# Patient Record
Sex: Female | Born: 1975 | Race: White | Hispanic: No | Smoking: Current every day smoker
Health system: Southern US, Community
[De-identification: ages and names within clinical notes are randomized; demographics above are authoritative.]

## PROBLEM LIST (undated history)

## (undated) DIAGNOSIS — N200 Calculus of kidney: Secondary | ICD-10-CM

## (undated) DIAGNOSIS — B1920 Unspecified viral hepatitis C without hepatic coma: Secondary | ICD-10-CM

## (undated) DIAGNOSIS — F1920 Other psychoactive substance dependence, uncomplicated: Secondary | ICD-10-CM

## (undated) HISTORY — DX: Other psychoactive substance dependence, uncomplicated: F19.20

## (undated) HISTORY — PX: TUBAL LIGATION: SHX77

## (undated) HISTORY — PX: NOVASURE ABLATION: SHX5394

## (undated) HISTORY — PX: OTHER SURGICAL HISTORY: SHX169

## (undated) HISTORY — DX: Unspecified viral hepatitis C without hepatic coma: B19.20

## (undated) HISTORY — PX: ABDOMINAL SURGERY: SHX537

---

## 1998-04-13 ENCOUNTER — Other Ambulatory Visit: Admission: RE | Admit: 1998-04-13 | Discharge: 1998-04-13 | Payer: Self-pay | Admitting: Obstetrics and Gynecology

## 1998-09-03 ENCOUNTER — Other Ambulatory Visit: Admission: RE | Admit: 1998-09-03 | Discharge: 1998-09-03 | Payer: Self-pay | Admitting: Obstetrics and Gynecology

## 1999-08-05 ENCOUNTER — Other Ambulatory Visit: Admission: RE | Admit: 1999-08-05 | Discharge: 1999-08-05 | Payer: Self-pay | Admitting: Gynecology

## 1999-10-22 ENCOUNTER — Ambulatory Visit (HOSPITAL_COMMUNITY): Admission: RE | Admit: 1999-10-22 | Discharge: 1999-10-22 | Payer: Self-pay | Admitting: Dentistry

## 1999-10-22 ENCOUNTER — Encounter: Payer: Self-pay | Admitting: Dentistry

## 2000-08-05 ENCOUNTER — Other Ambulatory Visit: Admission: RE | Admit: 2000-08-05 | Discharge: 2000-08-05 | Payer: Self-pay | Admitting: Gynecology

## 2001-08-25 ENCOUNTER — Other Ambulatory Visit: Admission: RE | Admit: 2001-08-25 | Discharge: 2001-08-25 | Payer: Self-pay | Admitting: Gynecology

## 2002-09-01 ENCOUNTER — Other Ambulatory Visit: Admission: RE | Admit: 2002-09-01 | Discharge: 2002-09-01 | Payer: Self-pay | Admitting: Gynecology

## 2002-09-08 ENCOUNTER — Ambulatory Visit (HOSPITAL_BASED_OUTPATIENT_CLINIC_OR_DEPARTMENT_OTHER): Admission: RE | Admit: 2002-09-08 | Discharge: 2002-09-08 | Payer: Self-pay | Admitting: Otolaryngology

## 2003-05-25 ENCOUNTER — Other Ambulatory Visit: Admission: RE | Admit: 2003-05-25 | Discharge: 2003-05-25 | Payer: Self-pay | Admitting: Gynecology

## 2008-06-16 ENCOUNTER — Inpatient Hospital Stay (HOSPITAL_COMMUNITY): Admission: AD | Admit: 2008-06-16 | Discharge: 2008-06-18 | Payer: Self-pay | Admitting: Obstetrics and Gynecology

## 2008-06-16 ENCOUNTER — Encounter (HOSPITAL_COMMUNITY): Payer: Self-pay | Admitting: Obstetrics and Gynecology

## 2008-10-14 ENCOUNTER — Ambulatory Visit (HOSPITAL_COMMUNITY): Admission: RE | Admit: 2008-10-14 | Discharge: 2008-10-14 | Payer: Self-pay | Admitting: Family Medicine

## 2010-02-11 ENCOUNTER — Ambulatory Visit (HOSPITAL_COMMUNITY): Admission: RE | Admit: 2010-02-11 | Payer: Self-pay | Admitting: Obstetrics and Gynecology

## 2010-04-06 ENCOUNTER — Encounter (HOSPITAL_COMMUNITY): Payer: Self-pay | Admitting: Obstetrics and Gynecology

## 2010-06-26 LAB — CBC
HCT: 34.1 % — ABNORMAL LOW (ref 36.0–46.0)
Hemoglobin: 10.5 g/dL — ABNORMAL LOW (ref 12.0–15.0)
Hemoglobin: 11.5 g/dL — ABNORMAL LOW (ref 12.0–15.0)
MCHC: 33.7 g/dL (ref 30.0–36.0)
MCHC: 33.7 g/dL (ref 30.0–36.0)
MCV: 94.3 fL (ref 78.0–100.0)
MCV: 94.8 fL (ref 78.0–100.0)
Platelets: 232 10*3/uL (ref 150–400)
RBC: 3.28 MIL/uL — ABNORMAL LOW (ref 3.87–5.11)
RBC: 3.62 MIL/uL — ABNORMAL LOW (ref 3.87–5.11)
RDW: 13.6 % (ref 11.5–15.5)
WBC: 10.3 10*3/uL (ref 4.0–10.5)
WBC: 11 10*3/uL — ABNORMAL HIGH (ref 4.0–10.5)

## 2010-07-09 ENCOUNTER — Emergency Department (HOSPITAL_COMMUNITY)
Admission: EM | Admit: 2010-07-09 | Discharge: 2010-07-09 | Disposition: A | Discharge status: Home / Self Care | Payer: Self-pay | Attending: Emergency Medicine | Admitting: Emergency Medicine

## 2010-07-09 DIAGNOSIS — F1110 Opioid abuse, uncomplicated: Secondary | ICD-10-CM | POA: Insufficient documentation

## 2010-07-30 NOTE — Op Note (Signed)
[name], [name]              ACCOUNT NO.:  [id number]   MEDICAL RECORD NO.:  [id number]          PATIENT TYPE:  INP   LOCATION:  9125                          FACILITY:  [facility]   PHYSICIAN:  Zelphia Cairo, MD    DATE OF BIRTH:  [birth year]   DATE OF PROCEDURE:  06/16/2008  DATE OF DISCHARGE:                               OPERATIVE REPORT   PREOPERATIVE DIAGNOSES:  1. Intrauterine pregnancy at 35 plus 6 weeks.  2. Preterm labor despite tocolytics.   PROCEDURE:  Repeat low transverse cesarean delivery.   SURGEON:  Zelphia Cairo, MD   ANESTHESIA:  Spinal.   FINDINGS:  Viable female infant with Apgars of 9 and 9 weighing 6 pounds  0 ounces, pH 7.30.   SPECIMEN:  Placenta to Pathology.   URINE OUTPUT:  Clear.   ESTIMATED BLOOD LOSS:  700 mL.   COMPLICATIONS:  None.   CONDITION:  Stable to recovery room.   PROCEDURE IN DETAIL:  Kayla Reese was taken to the operating room.  After  informed consent was obtained, she was placed in the supine position  with a left tilt.  She was prepped and draped in sterile fashion.  A  Foley catheter was inserted.  Pfannenstiel skin incision was made with a  scalpel and carried down to the underlying fascia.  The fascia was  incised in the midline.  This was extended laterally using Mayo  scissors.  The fascia was grasped with Kocher clamps, tented upwards,  and underlying rectus muscles were dissected off using curved Mayo  scissors.  Peritoneum was then identified and entered sharply with  Metzenbaum scissors.  This was extended superiorly and inferiorly with  good visualization of the bladder.  The bladder was tightly adhered to  the lower uterine segment.  This was gently dissected off using  Metzenbaum scissors.  The bladder blade was then placed.   Uterine incision was made with a scalpel.  Membranes ruptured for clear  fluid.  Fetal vertex was brought to the uterine incision.  The mouth and  nose were suctioned and the infant was  delivered with fundal pressure.  The cord was clamped and cut as the mouth and nose were suctioned and  the infant was taken to the awaiting pediatric staff.  Placenta was  manually removed from the uterus.  The uterus was cleared of all clots  and debris using a dry lap sponge and the uterine incision was  reapproximated in a double-layer closure using 0 chromic in a running  locked fashion.  Once hemostasis was assured, the uterus was placed back  into the pelvis.  The pelvis was irrigated with  warm normal saline.  Uterine incision was reinspected and found to be  hemostatic.  Peritoneum was reapproximated with 0 Monocryl.  The fascia  was closed with a looped 0 PDS and the skin was closed with staples.  Sponge, lap, needle, and instrument counts correct x2.      Zelphia Cairo, MD  Electronically Signed     GA/MEDQ  D:  06/16/2008  T:  06/16/2008  Job:  [id number]

## 2010-08-02 NOTE — Discharge Summary (Signed)
[name], Kayla Reese              ACCOUNT NO.:  [id number]   MEDICAL RECORD NO.:  [id number]          PATIENT TYPE:  INP   LOCATION:  9125                          FACILITY:  [facility]   PHYSICIAN:  Zelphia Cairo, MD    DATE OF BIRTH:  March 30, 1975   DATE OF ADMISSION:  06/16/2008  DATE OF DISCHARGE:  06/18/2008                               DISCHARGE SUMMARY   ADMITTING DIAGNOSES:  1. Intrauterine pregnancy at 35+ weeks' estimated gestational age.  2. Spontaneous onset of labor.  3. History of prior cesarean section.   DISCHARGE DIAGNOSES:  1. Status post low transverse cesarean section.  2. Viable female infant.   PROCEDURE:  Repeat low transverse cesarean section.   REASON FOR ADMISSION:  Please see written H&P.   HOSPITAL COURSE:  The patient is a 35 year old gravida 2, para 1 that  was admitted to [facility] at 35+ weeks estimated  gestational age with spontaneous onset of labor.  The patient had  history of a prior C-section.  On admission, the patient was given subcu  terbutaline 2 times without tocolysis of her labor.  The patient was now  taken to the operating room where spinal anesthesia was administered  without difficulty.  A low-transverse incision was made with delivery of  a viable female infant weighing 6 pounds 0 ounces, Apgars of 9 at 1  minute and 9 at 5 minutes.  The patient tolerated the procedure well and  taken to the recovery room in stable condition.  On postoperative day  #1, the patient was without complaint.  Vital signs remained stable.  She was afebrile.  Abdomen was soft with good return of bowel function.  Fundus was firm and nontender.  Abdominal dressing was noted to be  clean, dry, and intact.  Laboratory findings showed hemoglobin of 10.5,  platelet count of 200,000, WBC count of 11.0.  On postoperative day #2,  the patient did desire early discharge.  Vital signs were stable.  She  was afebrile.  Abdomen was soft.  Fundus firm  and nontender.  Incision  was clean, dry, and intact.  Staples were intact.   DISCHARGE INSTRUCTIONS:  Reviewed and the patient was later discharged  home.   CONDITION ON DISCHARGE:  Stable.   DIET:  Regular as tolerated.   ACTIVITY:  No heavy lifting, no driving x2 weeks, no vaginal entry.   FOLLOWUP:  The patient to follow up in the office in 2-3 days for staple  removal.  She is to call for temperature greater than 100 degrees,  persistent nausea, vomiting, heavy vaginal bleeding, and/or redness, or  drainage from incisional site.   DISCHARGE MEDICATIONS:  1. Tylox #31 p.o. every 4-6 hours p.r.n.  2. Motrin 600 mg every 6 hours.  3. Prenatal vitamins one p.o. daily.  4. Colace one p.o. daily.      Julio Sicks, N.P.      Zelphia Cairo, MD  Electronically Signed    CC/MEDQ  D:  07/03/2008  T:  07/04/2008  Job:  [id number]

## 2010-08-02 NOTE — Op Note (Signed)
NAME:  Kayla Reese, Kayla Reese                        ACCOUNT NO.:  [id number]   MEDICAL RECORD NO.:  [id number]                   PATIENT TYPE:  AMB   LOCATION:  [facility]                                 FACILITY:  [facility]   PHYSICIAN:  Joanna Puff, M.D.            DATE OF BIRTH:  07-Mar-1976   DATE OF PROCEDURE:  09/08/2002  DATE OF DISCHARGE:  09/08/2002                                 OPERATIVE REPORT   PREOPERATIVE DIAGNOSES:  1. Severely deviated septum producing marked nasal obstruction on the right     side.  2. External pyramid dip deformities.   POSTOPERATIVE DIAGNOSES:  1. Severely deviated septum producing marked nasal obstruction on the right     side.  2. External pyramid dip deformities.   OPERATION/PROCEDURE:  Septal rhinoplasty.   SURGEON:  Joanna Puff, M.D.   ANESTHESIA:  General endotracheal anesthesia by Mr. Greggory Stallion.   DESCRIPTION OF PROCEDURE:  The patient was brought to the operating room and  placed in the supine position.  Adequate general endotracheal anesthesia was  obtained.  The head was draped in the usual manner.  Supplemental anesthesia  was given in the nose by blocking and infiltrating the nose with 1%  Xylocaine with adrenalin and topical application of 4% cocaine solution  intranasally.   After adequate time for the local to work well, bilateral intercartilaginous  incisions were made and small retaining edge of septal cartilage then  trimmed.  The skin was then undermined over the dorsum of the nose.  Rim  excision and lateral cartilage delivered.  There was a marked ballooning of  lateral ________and this was corrected with complete strip technique.  A  button-end knife was then inserted into the intercartilaginous incision and  brought over the caudal nose to make a complete transfixion incision.  Bilateral  mucoperichondrial and mucoperiosteal elevated.  This exposed the  cartilaginous bony septum as well as the severe deviation.  The  septum was  off the spiny crest in the right ala, producing severe buckling.  There was  also evidence of an old fracture site at this point.  Inferior strip of  cartilaginous strip was then removed.  The cartilaginous septum was then  dissected free from the bony septum and a marked bony spur was removed.  The  angulation of the cartilaginous septum was then improved by removing a strip  of cartilaginous septum at the severe bend.  This allowed the septum to  spring free back to the midline.  Submucoperichondrial tunnels were then  made beneath the junction of upper lateral cartilage and cartilaginous  dorsum.  These two structures were separated with angled scissors.  The  cartilaginous dorsum in the lower midline _______ of the superior aspect of  the nostril and then using a 12 mm chisel, slight fullness along the nasal  dorsum was removed.  The upper lateral cartilage and  cartililaginous dorsum  were further lowered  to conform to the new ____ nose and bony pyramid.  Vestibular incision made in the lateral _______ were performed with the  guarded chisel.  Infracture of nasal bone was carried out.  Inspection of  the inner septum and ________ revealed it to be in good alignment.  There  was still some fullness in the nasal tip and this was corrected by  delivering the lower lateral cartilage to the right rim incision.  The dome  areas were sutured together with interrupted suture of 5-0 Monocryl.  The  tip was returned to the midline and there was good alignment of the pyramid  and tip.  The transfixion incision was then closed with a through-and-  through mattress suture of 3-0 plain catgut as well as interrupted suture of  5-0 plain catgut.  Rim incisions were sutured with 5-0 plain catgut.  A  small incision was made in the columella on the right side and a pocket was  made in front of the mesial crura.  A cartilaginous strip was then placed in  front of this area.  This incision was  closed with 5-0 plain catgut.  Intranasal splints inserted and sutured in place with 3-0 Ethilon.  An  external splint was applied.  The patient was then awakened and extubated  and returned to the recovery room for x-ray prior to being discharged home.   She is scheduled for the office in 24 hours for routine followup.   DISCHARGE MEDICATIONS:  1. Cefalexin 250 mg one q.i.d.  2. Sterapred 5 mg Dosepak.  3. Vicodin 7.5 mg one tablet q.3-4h. p.r.n. for pain.   CONDITION ON DISCHARGE:  Good.   COMPLICATIONS:  None.                                               Joanna Puff, M.D.    LLP/MEDQ  D:  09/11/2002  T:  09/11/2002  Job:  [id number]

## 2010-08-02 NOTE — Op Note (Signed)
NAME:  Kayla Reese, Kayla Reese                        ACCOUNT NO.:  [id number]   MEDICAL RECORD NO.:  [id number]                   PATIENT TYPE:  AMB   LOCATION:  N[facility]                                 FACILITY:  [facility]   PHYSICIAN:  Joanna Puff, M.D.            DATE OF BIRTH:  1975-06-26   DATE OF PROCEDURE:  09/08/2002  DATE OF DISCHARGE:  09/08/2002                                 OPERATIVE REPORT   PREOPERATIVE DIAGNOSES:  1. Severely deviated septum producing marked nasal obstruction on the right     side.  2. External paramedian tip deformities.   POSTOPERATIVE DIAGNOSES:  1. Severely deviated septum producing marked nasal obstruction on the right     side.  2. External paramedian tip deformities.   OPERATION:  Septorhinoplasty.   SURGEON:  Joanna Puff, M.D.   ANESTHESIA:  General endotracheal per Mr. Greggory Stallion.   DESCRIPTION OF PROCEDURE:  The patient was brought to the operating room and  placed in a supine position. After adequate general endotracheal anesthesia  was obtained, the head was draped in the usual manner. Septal anesthesia was  given through the nose by blocking and  infiltrating the nose with 1%  Xylocaine with adrenaline and topical application of 4% cocaine solution  intranasally.   After adequate time for the local anesthesia to work well, bilateral  intercartilaginous incisions were made and the small returning edge of the  upper lateral cartilage was then trimmed. The skin was then further mined  over the dorsum of the nose. Rim incisions were made and the lower lateral  cartilage was delivered. There was marked ballooning of the lateral crus.  This was corrected by using a complete rim strip technique. A buttoning  knife was then inserted through the intercartilaginous incision and brought  onto the caudal end of the nose making for a complete transfix incision.  Bilateral mucoperichondrial and mucoperiosteal flaps  were elevated. This  exposed the cartilaginous and bony septum and this exposed the severe  deviations of the cartilaginous and bony septum. The septum was off the  nasal spine into the right airway, produced severe bucking. There was also  evidence of old fracture site at this point. An inferior strip of  cartilaginous septum was then removed. The cartilaginous septum was  dissected free from the bleeding edge of the perpendicular plate of the  ethmoid and vomer and a large bony spur was removed. Angulation of the  cartilaginous septum was then improved by removing a strip of cartilaginous  septum at the severe bend. This allowed the septum to swing free back to the  midline. Submucoperichondral tunnels were then made beneath the junction of  the upper lateral cartilage and cartilaginous dorsum. These two structures  were separated with angles scissors. The cartilaginous dorsum then lowered  to about the level of the superior aspect of the nostril. Then using a 14 mm  chisel, the asymmetrical bony  pyramid hump was removed. The upper lateral  cartilage and cartilaginous dorsum was further lowered to conform to the new  height of the nasal bony pyramid. Vesicular incision made, lateral  osteotomies were performed with a guarded chisel. In-fracture of the nasal  bone was carried out. Inspection revealed the septum and pyramid to be in  good alignment. There was still some fullness to the nasal tip. This was  corrected by delivering the lower lateral cartilage through the right rim  incision. The domes of these two cartilages were sutured together with  interrupted sutures of 5-0 Monocryl. It was returned to the midline with a  good alignment of the pyramid and tip. The transfixing incision was then  closed with a base suture through and through plain catgut as well as  interrupted sutures of 5-0 plain catgut. The rim incisions were closed with  interrupted suture of 5-0 plain catgut. A small incision made on the  right  side of the columella and a pocket was created in front of the mesial crura.  A cartilaginous strut was then placed in front of the medial crura.  This  incision was closed with 5-0 plain catgut. Intranasal splints inserted and  sutured in place with 3-0 Ethilon. An external splint was applied. The  patient was then awakened, extubated and returned to the recovery room prior  to being discharged home. She is scheduled to return to the hospital in 24  hours for routine followup.   DISCHARGE MEDICATIONS:  1. Cephalexin 250 mg p.o. q.i.d.  2. Sterapred 5 mg Dosepak.  3. Vicodin 7.5 mg one tablet every 3-4h. p.r.n. for pain.   CONDITION ON DISCHARGE:  Good.   COMPLICATIONS:  None.                                               Joanna Puff, M.D.    LLP/MEDQ  D:  12/05/2002  T:  12/05/2002  Job:  [id number]

## 2010-08-02 NOTE — Op Note (Signed)
NAME:  Kayla Reese, Kayla Reese                        ACCOUNT NO.:  [id number]   MEDICAL RECORD NO.:  [id number]                   PATIENT TYPE:  AMB   LOCATION:  [facility]                                 FACILITY:  [facility]   PHYSICIAN:  Joanna Puff, M.D.            DATE OF BIRTH:  09-05-1975   DATE OF PROCEDURE:  09/08/2002  DATE OF DISCHARGE:  09/08/2002                                 OPERATIVE REPORT   PREOPERATIVE DIAGNOSES:  1. Deviated septum.  2. External pyramid tip deformities.   POSTOPERATIVE DIAGNOSES:  1. Deviated septum.  2. External pyramid tip deformities.   OPERATION/PROCEDURE:  Septal rhinoplasty.   SURGEON:  Joanna Puff, M.D.   ANESTHESIA:  General endotracheal anesthesia.   DESCRIPTION OF PROCEDURE:  The patient was brought to the operating room and  placed in the supine position. After adequate general anesthesia had been  obtained, the head was draped in the usual manner.  Supplemental anesthesia  given in the nose with blocking and infiltrating the nose with 1% Xylocaine  with adrenalin and a topical application of 4% cocaine solution  intranasally.   After adequate time for local to work well, bilateral intercartilaginous  incisions were made and upper lateral cartilage exposed. Skin was then  further mounted over the dorsal nose.  A small returning edge of upper  lateral cartilage was trimmed.  The rim incision was then made and the lower  lateral cartilage delivered.  It was marked including the lateral crease.  This was to correct the complete rim strip technique.  A button knife was  then inserted through the intercartilaginous incision and brought out caudal  end of nose which made for complete transfixing incision.  Bilateral  mucoperichondrial and mucoperiosteal flaps were elevated.  This exposed a  marked deviation of the septum, particularly the right side.  The  cartilaginous deviation was then removed by sharp dissection.  The  cartilaginous septum was then dissected free from the leading edge of  perpendicular ethmoid and vomer, and a large bony spur as well as a widened  crest was corrected with chisels and rongeurs.  The septum was returned to  the midline.  There was good airway bilaterally.  Submucoperichondrial  tunnels were then made beneath the junction of the upper lateral cartilage  and cartilaginous dorsum.  These two structures were separated with angled  scissors.  Cartilaginous dorsum was enlarged slightly about the level of the  superior aspect of the nostril.  Then using a 14 mm chisel, the asymmetry of  the nasal pyramid was corrected.  The upper lateral cartilage and  cartilaginous dorsum were then further lowered to conform to the new height  of the nasal bony pyramid.  Vestibular incision made on the lateral os to os  with a Fomon guarded chisel.  Infracture of the nasal bone was carried.  Inspection then revealed septum appeared to be in alignment.  There was  still some fullness in the nasal tip and this was corrected by delivering  the lower lateral cartilage through the right rim incision.  The domes of  the lower lateral cartilage were then sutured together with interrupted 6-0  and 5-0 Monocryl.  It was returned to the midline.  There was good narrowing  of the tip of the nose.  The transfixation suture was then closed with base  suture of 3-0 plain catgut as well as interrupted suture of 5-0 plain  catgut.  Rim incision closed with interrupted suture of 5-0 plain catgut.  Intranasal splints inserted and sutured in place with 3-0 Ethilon.  An  external splint was applied.  The patient then awakened, extubated and  returned to the recovery room for activity prior to being discharged home.   She is scheduled in the office in 24 hours for routine followup.   DISCHARGE MEDICATIONS:  1. Cefalexin 250 mg p.o. q.i.d.  2. Sterapred ophthalmic Dosepak.  3. Mepergan Fortis one tablet q.3-4h.  p.r.n. for pain.   CONDITION ON DISCHARGE:  Good.   COMPLICATIONS:  None                                               Joanna Puff, M.D.    LLP/MEDQ  D:  11/04/2002  T:  11/05/2002  Job:  [id number]

## 2010-08-25 ENCOUNTER — Inpatient Hospital Stay (INDEPENDENT_AMBULATORY_CARE_PROVIDER_SITE_OTHER)
Admission: RE | Admit: 2010-08-25 | Discharge: 2010-08-25 | Disposition: A | Discharge status: Home / Self Care | Payer: Self-pay | Source: Ambulatory Visit | Attending: Emergency Medicine | Admitting: Emergency Medicine

## 2010-08-25 ENCOUNTER — Ambulatory Visit (INDEPENDENT_AMBULATORY_CARE_PROVIDER_SITE_OTHER): Payer: Self-pay

## 2010-08-25 DIAGNOSIS — M549 Dorsalgia, unspecified: Secondary | ICD-10-CM

## 2010-08-25 LAB — POCT URINALYSIS DIP (DEVICE)
Bilirubin Urine: NEGATIVE
Glucose, UA: NEGATIVE mg/dL
Hgb urine dipstick: NEGATIVE
Nitrite: NEGATIVE
Specific Gravity, Urine: 1.02 (ref 1.005–1.030)
Urobilinogen, UA: 0.2 mg/dL (ref 0.0–1.0)
pH: 7 (ref 5.0–8.0)

## 2011-06-21 ENCOUNTER — Emergency Department (HOSPITAL_COMMUNITY)
Admission: EM | Admit: 2011-06-21 | Discharge: 2011-06-22 | Disposition: A | Discharge status: Home / Self Care | Payer: Self-pay | Attending: Emergency Medicine | Admitting: Emergency Medicine

## 2011-06-21 ENCOUNTER — Encounter (HOSPITAL_COMMUNITY): Payer: Self-pay

## 2011-06-21 DIAGNOSIS — F17200 Nicotine dependence, unspecified, uncomplicated: Secondary | ICD-10-CM | POA: Insufficient documentation

## 2011-06-21 DIAGNOSIS — R51 Headache: Secondary | ICD-10-CM | POA: Insufficient documentation

## 2011-06-21 DIAGNOSIS — F1110 Opioid abuse, uncomplicated: Secondary | ICD-10-CM | POA: Insufficient documentation

## 2011-06-21 DIAGNOSIS — F1910 Other psychoactive substance abuse, uncomplicated: Secondary | ICD-10-CM

## 2011-06-21 LAB — COMPREHENSIVE METABOLIC PANEL WITH GFR
ALT: 13 U/L (ref 0–35)
AST: 15 U/L (ref 0–37)
Albumin: 4.2 g/dL (ref 3.5–5.2)
Alkaline Phosphatase: 68 U/L (ref 39–117)
BUN: 15 mg/dL (ref 6–23)
CO2: 28 meq/L (ref 19–32)
Calcium: 9.4 mg/dL (ref 8.4–10.5)
Chloride: 99 meq/L (ref 96–112)
Creatinine, Ser: 0.66 mg/dL (ref 0.50–1.10)
GFR calc Af Amer: >90 mL/min
GFR calc non Af Amer: >90 mL/min
Glucose, Bld: 101 mg/dL — ABNORMAL HIGH (ref 70–99)
Potassium: 3.4 meq/L — ABNORMAL LOW (ref 3.5–5.1)
Sodium: 136 meq/L (ref 135–145)
Total Bilirubin: 0.4 mg/dL (ref 0.3–1.2)
Total Protein: 7.8 g/dL (ref 6.0–8.3)

## 2011-06-21 LAB — CBC
HCT: 43.7 % (ref 36.0–46.0)
Hemoglobin: 14.9 g/dL (ref 12.0–15.0)
MCH: 29.7 pg (ref 26.0–34.0)
MCV: 87.1 fL (ref 78.0–100.0)
RBC: 5.02 MIL/uL (ref 3.87–5.11)

## 2011-06-21 LAB — RAPID URINE DRUG SCREEN, HOSP PERFORMED
Amphetamines: NOT DETECTED
Barbiturates: NOT DETECTED
Benzodiazepines: NOT DETECTED
Cocaine: NOT DETECTED
Opiates: POSITIVE — AB
Tetrahydrocannabinol: POSITIVE — AB

## 2011-06-21 LAB — ETHANOL: Alcohol, Ethyl (B): <11 mg/dL (ref 0–11)

## 2011-06-21 LAB — PREGNANCY, URINE: Preg Test, Ur: NEGATIVE

## 2011-06-21 MED ORDER — METHOCARBAMOL 500 MG PO TABS
500.0000 mg | ORAL_TABLET | Freq: Three times a day (TID) | ORAL | Status: DC | PRN
  Filled 2011-06-21: qty 1

## 2011-06-21 MED ORDER — DICYCLOMINE HCL 20 MG PO TABS: 20.0000 mg | ORAL_TABLET | ORAL | Status: DC | PRN

## 2011-06-21 MED ORDER — NAPROXEN 500 MG PO TABS
500.0000 mg | ORAL_TABLET | Freq: Two times a day (BID) | ORAL | Status: DC | PRN
  Administered 2011-06-21: 500 mg via ORAL
  Filled 2011-06-21: qty 1

## 2011-06-21 MED ORDER — LOPERAMIDE HCL 2 MG PO CAPS
2.0000 mg | ORAL_CAPSULE | ORAL | Status: DC | PRN
Start: 2011-06-21 — End: 2011-06-22

## 2011-06-21 MED ORDER — CLONIDINE HCL 0.1 MG PO TABS
0.1000 mg | ORAL_TABLET | Freq: Four times a day (QID) | ORAL | Status: DC
  Administered 2011-06-21: 0.1 mg via ORAL
  Filled 2011-06-21 (×2): qty 1

## 2011-06-21 MED ORDER — DIPHENHYDRAMINE HCL 25 MG PO CAPS
25.0000 mg | ORAL_CAPSULE | Freq: Once | ORAL | Status: AC
  Administered 2011-06-21: 25 mg via ORAL
  Filled 2011-06-21: qty 1

## 2011-06-21 MED ORDER — ONDANSETRON 4 MG PO TBDP: 4.0000 mg | ORAL_TABLET | Freq: Four times a day (QID) | ORAL | Status: DC | PRN

## 2011-06-21 MED ORDER — CLONIDINE HCL 0.1 MG PO TABS: 0.1000 mg | ORAL_TABLET | Freq: Every day | ORAL | Status: DC

## 2011-06-21 MED ORDER — CLONIDINE HCL 0.1 MG PO TABS: 0.1000 mg | ORAL_TABLET | ORAL | Status: DC

## 2011-06-21 MED ORDER — NICOTINE 21 MG/24HR TD PT24
21.0000 mg | MEDICATED_PATCH | Freq: Every day | TRANSDERMAL | Status: DC
  Administered 2011-06-21: 21 mg via TRANSDERMAL
  Filled 2011-06-21: qty 1

## 2011-06-21 MED ORDER — HYDROXYZINE HCL 25 MG PO TABS: 25.0000 mg | ORAL_TABLET | Freq: Four times a day (QID) | ORAL | Status: DC | PRN

## 2011-06-21 NOTE — ED Notes (Signed)
Pt's mother took pt's belongings home.

## 2011-06-21 NOTE — ED Notes (Signed)
Pt declined nicotine patch.

## 2011-06-21 NOTE — ED Notes (Signed)
Pt in from home request detox from heroin states last use Thursday and today denies s/i denies h/i states headache at present time pt presents tearful however cooperative

## 2011-06-21 NOTE — ED Provider Notes (Signed)
History     CSN: [id number]  Arrival date & time 06/21/11  1717   First MD Initiated Contact with Patient 06/21/11 1750      Chief Complaint  Patient presents with  . 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 She does take Suboxone. She is trying to get into a rehabilitation program at rehabilitation program does not to detox. She denies any threats or herself or others. She denies suicidal or homicidal ideation denies hallucinations. She denies withdrawal symptoms now. She took her Suboxone today. Her last heroine IV was 2 days ago. Her last detox was in the 1990s. She is a gradual onset typical headache for her today for several hours with no sudden headache and no focal neurologic symptoms and no change in speech vision swallowing or understanding or localized weakness numbness or incoordination. History reviewed. No pertinent past medical history. Substance abuse Past Surgical History  Procedure Date  . Abdominal surgery     No family history on file.  History  Substance Use Topics  . Smoking status: Current Everyday Smoker  . Smokeless tobacco: Not on file  . Alcohol Use: No    OB History    Grav Para Term Preterm Abortions TAB SAB Ect Mult Living                  Review of Systems  Constitutional: Negative for fever.       10 Systems reviewed and are negative for acute change except as noted in the HPI.  HENT: Negative for congestion.   Eyes: Negative for discharge and redness.  Respiratory: Negative for cough and shortness of breath.   Cardiovascular: Negative for chest pain.  Gastrointestinal: Negative for vomiting and abdominal pain.  Musculoskeletal: Negative for back pain.  Skin: Negative for rash.  Neurological: Positive for headaches. Negative for syncope, speech difficulty, weakness and numbness.    Psychiatric/Behavioral:       No behavior change.    Allergies  Review of patient's allergies indicates no known allergies.  Home Medications   Current Outpatient Rx  Name Route Sig Dispense Refill  . BUPRENORPHINE HCL-NALOXONE HCL 8-2 MG SL SUBL Sublingual Place 1 tablet under the tongue 2 (two) times daily.      BP 102/75  Pulse 82  Temp(Src) 98.8 F (37.1 C) (Oral)  Resp 20  SpO2 98%  LMP 06/07/2011  Physical Exam  Nursing note and vitals reviewed. Constitutional:       Awake, alert, nontoxic appearance with baseline speech for patient.  HENT:  Head: Atraumatic.  Mouth/Throat: No oropharyngeal exudate.  Eyes: EOM are normal. Pupils are equal, round, and reactive to light. Right eye exhibits no discharge. Left eye exhibits no discharge.  Neck: Neck supple.  Cardiovascular: Normal rate and regular rhythm.   No murmur heard. Pulmonary/Chest: Effort normal and breath sounds normal. No stridor. No respiratory distress. She has no wheezes. She has no rales. She exhibits no tenderness.  Abdominal: Soft. Bowel sounds are normal. She exhibits no mass. There is no tenderness. There is no rebound.  Musculoskeletal: She exhibits no tenderness.       Baseline ROM, moves extremities with no obvious new focal weakness.  Lymphadenopathy:    She has no cervical adenopathy.  Neurological: She is alert.       Awake, alert, cooperative and aware of situation; motor strength bilaterally; sensation normal to light touch bilaterally; peripheral visual  fields full to confrontation; no facial asymmetry; tongue midline; major cranial nerves appear intact; no pronator drift, normal finger to nose bilaterally  Skin: No rash noted.  Psychiatric: She has a normal mood and affect.    ED Course  Procedures (including critical care time)  Labs Reviewed  COMPREHENSIVE METABOLIC PANEL - Abnormal; Notable for the following:    Potassium 3.4 (*)    Glucose, Bld 101 (*)    All other components  within normal limits  URINE RAPID DRUG SCREEN (HOSP PERFORMED) - Abnormal; Notable for the following:    Opiates POSITIVE (*)    Tetrahydrocannabinol POSITIVE (*)    All other components within normal limits  CBC  ETHANOL  PREGNANCY, URINE  LAB REPORT - SCANNED   No results found.   1. Substance abuse       MDM  Patient / Family / Caregiver understand and agree with initial ED impression and plan with expectations set for ED visit.        Hurman Horn, MD 06/23/11 2114

## 2011-06-22 NOTE — ED Notes (Signed)
Dr knapp into see 
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
## 2011-06-22 NOTE — ED Notes (Signed)
Mom back into see, pt resting quietly

## 2011-06-22 NOTE — ED Notes (Signed)
Talking w/ mother 

## 2011-06-22 NOTE — ED Notes (Signed)
Pt's mom into see 

## 2011-06-22 NOTE — ED Provider Notes (Addendum)
Patient is here requesting assistance with substance abuse problems.  She's not suicidal or homicidal.  She would like to be released but does want to go to a treatment center.  Filed Vitals:   06/22/11 0549  BP: 88/50  Pulse: 61  Temp: 98.4 F (36.9 C)  Resp: 18   We'll have the act team talked to the patient today about getting her transfer over to the inpatient treatment center that she has made arrangements with.  Celene Kras, MD 06/22/11 [phone]  Pt was accepted at [facility], MD 06/22/11 1415

## 2011-06-22 NOTE — ED Notes (Signed)
Pt dc'd w/ written instructions, pt to go to [facility] for admission

## 2011-06-22 NOTE — ED Notes (Signed)
Husband into see 

## 2011-06-22 NOTE — ED Notes (Signed)
ACT into see-mom at bedside

## 2011-06-22 NOTE — Discharge Instructions (Signed)
Chemical Dependency Chemical dependency is an addiction to drugs or alcohol. It is characterized by the repeated behavior of seeking out and using drugs and alcohol despite harmful consequences to the health and safety of ones self and others.  RISK FACTORS There are certain situations or behaviors that increase a person's risk for chemical dependency. These include:  A family history of chemical dependency.   A history of mental health issues, including depression and anxiety.   A home environment where drugs and alcohol are easily available to you.   Drug or alcohol use at a young age.  SYMPTOMS  The following symptoms can indicate chemical dependency:  Inability to limit the use of drugs or alcohol.   Nausea, sweating, shakiness, and anxiety that occurs when alcohol or drugs are not being used.   An increase in amount of drugs or alcohol that is necessary to get drunk or high.  People who experience these symptoms can assess their use of drugs and alcohol by asking themselves the following questions:  Have you been told by friends or family that they are worried about your use of alcohol or drugs?   Do friends and family ever tell you about things you did while drinking alcohol or using drugs that you do not remember?   Do you lie about using alcohol or drugs or about the amounts you use?   Do you have difficulty completing daily tasks unless you use alcohol or drugs?   Is the level of your work or school performance lower because of your drug or alcohol use?   Do you get sick from using drugs or alcohol but keep using anyway?   Do you feel uncomfortable in social situations unless you use alcohol or drugs?   Do you use drugs or alcohol to help forget problems?  An answer of yes to any of these questions may indicate chemical dependency. Professional evaluation is suggested. Document Released: 02/25/2001 Document Revised: 02/20/2011 Document Reviewed: 05/09/2010 ExitCare  Patient Information 2012 [employer]. 

## 2011-06-22 NOTE — BHH Counselor (Signed)
Patient referred to [facility] after verifying bed availability. Phone referral completed. Faxed patients labs and clinicals to their facility so that they may review and keep for their records. Patient officially accepted to [facility] and will need to be their prior to 4pm today.

## 2011-06-22 NOTE — ED Notes (Signed)
Belongings returned

## 2011-11-26 ENCOUNTER — Ambulatory Visit: Payer: Self-pay | Admitting: Family Medicine

## 2011-11-26 ENCOUNTER — Ambulatory Visit: Payer: Self-pay

## 2011-11-26 VITALS — BP 96/58 | HR 121 | Temp 99.8°F | Resp 18 | Ht 64.0 in | Wt 118.0 lb

## 2011-11-26 DIAGNOSIS — R0602 Shortness of breath: Secondary | ICD-10-CM

## 2011-11-26 DIAGNOSIS — B379 Candidiasis, unspecified: Secondary | ICD-10-CM

## 2011-11-26 DIAGNOSIS — M549 Dorsalgia, unspecified: Secondary | ICD-10-CM

## 2011-11-26 DIAGNOSIS — R000 Tachycardia, unspecified: Secondary | ICD-10-CM

## 2011-11-26 DIAGNOSIS — J189 Pneumonia, unspecified organism: Secondary | ICD-10-CM

## 2011-11-26 LAB — POCT CBC
Granulocyte percent: 92 %G — AB (ref 37–80)
HCT, POC: 45.7 % (ref 37.7–47.9)
Hemoglobin: 14.6 g/dL (ref 12.2–16.2)
Lymph, poc: 0.8 (ref 0.6–3.4)
MCH, POC: 29.3 pg (ref 27–31.2)
MCHC: 31.9 g/dL (ref 31.8–35.4)
MCV: 91.6 fL (ref 80–97)
MID (cbc): 0.5 (ref 0–0.9)
MPV: 7.4 fL (ref 0–99.8)
POC Granulocyte: 14.8 — AB (ref 2–6.9)
POC LYMPH PERCENT: 4.8 %L — AB (ref 10–50)
POC MID %: 3.2 % (ref 0–12)
Platelet Count, POC: 264 10*3/uL (ref 142–424)
RBC: 4.99 M/uL (ref 4.04–5.48)
RDW, POC: 14 %
WBC: 16.1 10*3/uL — AB (ref 4.6–10.2)

## 2011-11-26 MED ORDER — FLUCONAZOLE 150 MG PO TABS: 150.0000 mg | ORAL_TABLET | Freq: Once | ORAL | Status: AC

## 2011-11-26 MED ORDER — KETOROLAC TROMETHAMINE 60 MG/2ML IM SOLN
60.0000 mg | Freq: Once | INTRAMUSCULAR | Status: AC
  Administered 2011-11-26: 60 mg via INTRAMUSCULAR

## 2011-11-26 MED ORDER — CEFTRIAXONE SODIUM 1 G IJ SOLR
1.0000 g | Freq: Once | INTRAMUSCULAR | Status: AC
  Administered 2011-11-26: 1 g via INTRAMUSCULAR

## 2011-11-26 MED ORDER — DOXYCYCLINE HYCLATE 100 MG PO TABS: 100.0000 mg | ORAL_TABLET | Freq: Two times a day (BID) | ORAL | Status: AC

## 2011-11-26 NOTE — Progress Notes (Signed)
[facility]:  Office Visit  Chief Complaint:  Chief Complaint  Patient presents with  . Shortness of Breath    * 2 days  . Cough    productive  . Generalized Body Aches  . Back Pain    LBP  . Fever    HPI: Kayla Reese is a 36 y.o. female who complains of  Acute SOB,cough with clear mucus, fever, chest pain with cough and deep breaths. Took tylenol, ibupofen withouit relief. She has back pain associated with this. No sick contacts but kids under age 57 went back to school recently. Denies being immune compromised.   Past Medical History  Diagnosis Date  . Substance dependence     on suboxone   Past Surgical History  Procedure Date  . Abdominal surgery    History   Social History  . Marital Status: Married    Spouse Name: N/A    Number of Children: N/A  . Years of Education: N/A   Social History Main Topics  . Smoking status: Current Every Day Smoker  . Smokeless tobacco: None  . Alcohol Use: No  . Drug Use: Yes  . Sexually Active:    Other Topics Concern  . None   Social History Narrative  . None   No family history on file. Allergies  Allergen Reactions  . Erythromycin Rash   Prior to Admission medications   Medication Sig Start Date End Date Taking? Authorizing Provider  buprenorphine-naloxone (SUBOXONE) 8-2 MG SUBL Place 1 tablet under the tongue 2 (two) times daily.    Historical Provider, MD     ROS: The patient denies fevers, chills, night sweats, unintentional weight loss, wheezing, dyspnea on exertion, nausea, vomiting, abdominal pain, dysuria, hematuria, melena, numbness, weakness, or tingling.   All other systems have been reviewed and were otherwise negative with the exception of those mentioned in the HPI and as above.    PHYSICAL EXAM: Filed Vitals:   11/26/11 1924  BP: 96/58  Pulse: 121  Temp: 99.8 F (37.7 C)  Resp: 18   Filed Vitals:   11/26/11 1924  Height: 5\' 4"  (1.626 m)  Weight: 118 lb (53.524 kg)     Body mass index is 20.25 kg/(m^2).  General: Alert, weeping, appears very distress concern about pain HEENT:  Normocephalic, atraumatic, oropharynx patent.  Cardiovascular:  Tachycardic Regular rhythm, no rubs murmurs or gallops.  No Carotid bruits, radial pulse intact. No pedal edema.  Respiratory: Clear to auscultation bilaterally.  No wheezes, + rales right ll, no rhonchi.  No cyanosis, no use of accessory musculature GI: No organomegaly, abdomen is soft and non-tender, positive bowel sounds.  No masses. Skin: No rashes. Neurologic: Facial musculature symmetric. Psychiatric: Patient is appropriate throughout our interaction. Lymphatic: No cervical lymphadenopathy Musculoskeletal: Gait intact.   LABS: Results for orders placed in visit on 11/26/11  POCT CBC      Component Value Range   WBC 16.1 (*) 4.6 - 10.2 K/uL   Lymph, poc 0.8  0.6 - 3.4   POC LYMPH PERCENT 4.8 (*) 10 - 50 %L   MID (cbc) 0.5  0 - 0.9   POC MID % 3.2  0 - 12 %M   POC Granulocyte 14.8 (*) 2 - 6.9   Granulocyte percent 92.0 (*) 37 - 80 %G   RBC 4.99  4.04 - 5.48 M/uL   Hemoglobin 14.6  12.2 - 16.2 g/dL   HCT, POC 16.1  09.6 - 47.9 %  MCV 91.6  80 - 97 fL   MCH, POC 29.3  27 - 31.2 pg   MCHC 31.9  31.8 - 35.4 g/dL   RDW, POC 21.3     Platelet Count, POC 264  142 - 424 K/uL   MPV 7.4  0 - 99.8 fL     EKG/XRAY:   Primary read interpreted by Dr. Conley Rolls at [facility]. ? Left infiltrate, ? Right fluid/shadow costophrenic border   ASSESSMENT/PLAN: Encounter Diagnoses  Name Primary?  . SOB (shortness of breath) Yes  . Tachycardia   . Pneumonia   . Back pain   . Yeast infection    Patient is very anxious and weeping. Rx Doxycycline for PNA, she was given Rocephin 1 gram in the office, I also rx a diflucan 150 mg pill for yeast infection with abx  She was given a toradol injection for her back pain. I did not prescribe her nay painmeds due to the fact that she is on suboxone and has a h/o substance  dependence She currently denies taking any illicit drugs F/u if worsening sxs or go to ER    Kimarie Coor PHUONG, DO 11/27/2011 8:14 AM

## 2011-11-26 NOTE — Patient Instructions (Signed)

## 2011-11-27 ENCOUNTER — Encounter: Payer: Self-pay | Admitting: Family Medicine

## 2012-07-18 IMAGING — CR DG LUMBAR SPINE COMPLETE 4+V
6 series · 6 of 6 positions shown · non-contrast
Comparison: None.

CLINICAL DATA: Chronic lower back pain.  Negative pregnancy test.

LUMBAR SPINE - COMPLETE 4+ VIEW

[view not recorded (1 of 6)]
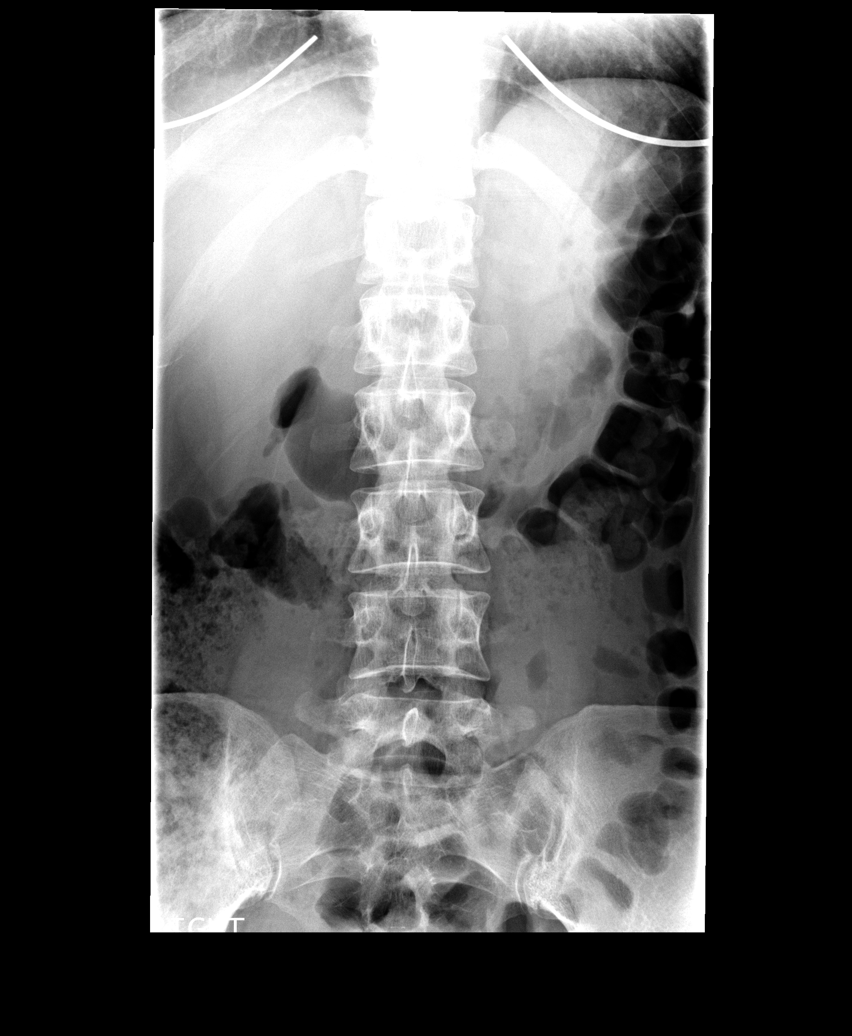

[view not recorded (2 of 6)]
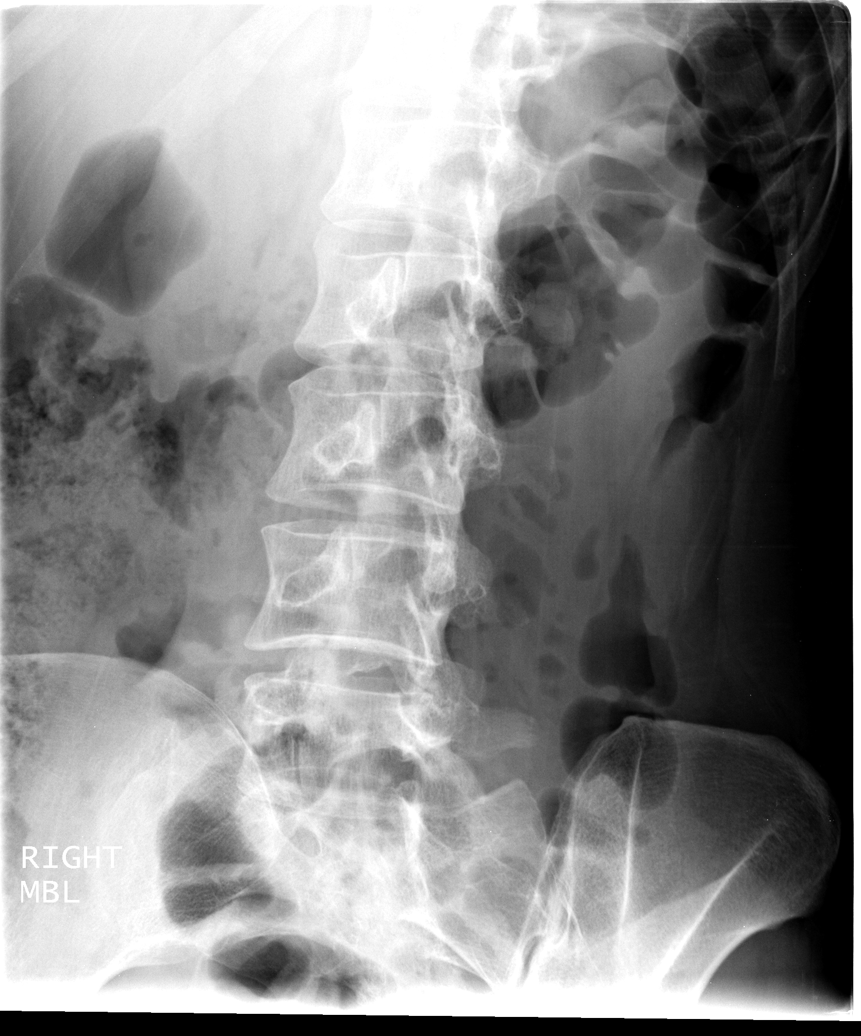

[view not recorded (3 of 6)]
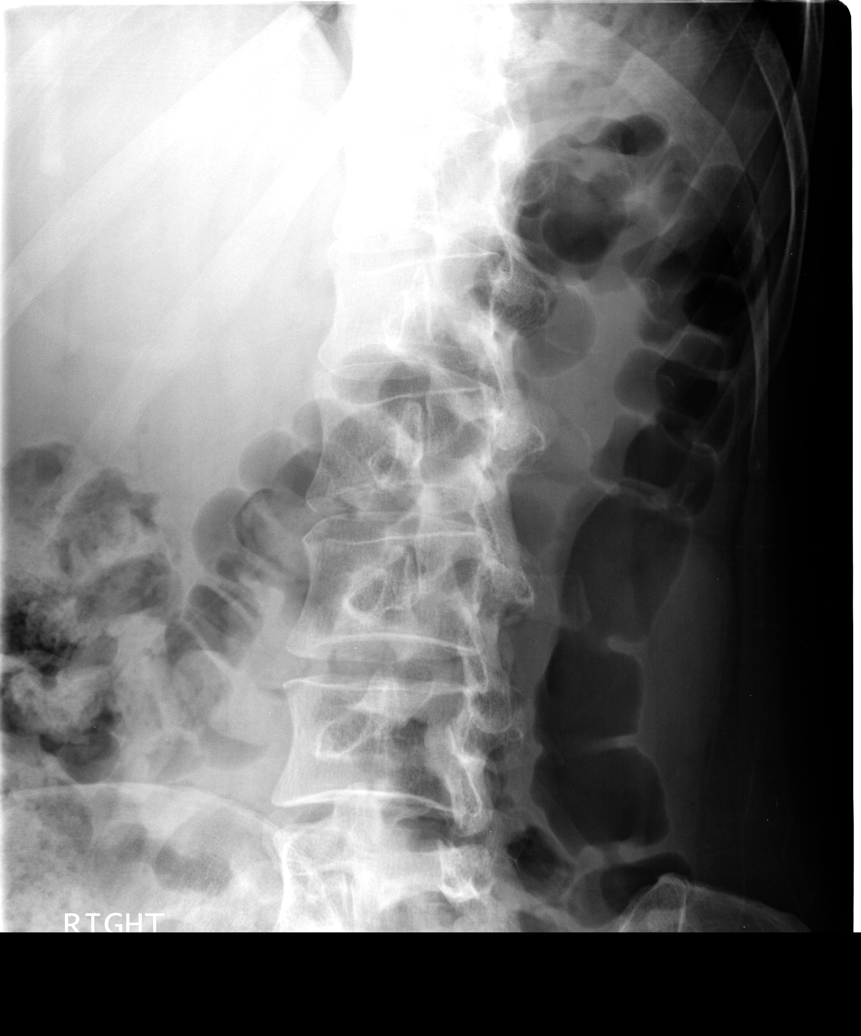

[view not recorded (4 of 6)]
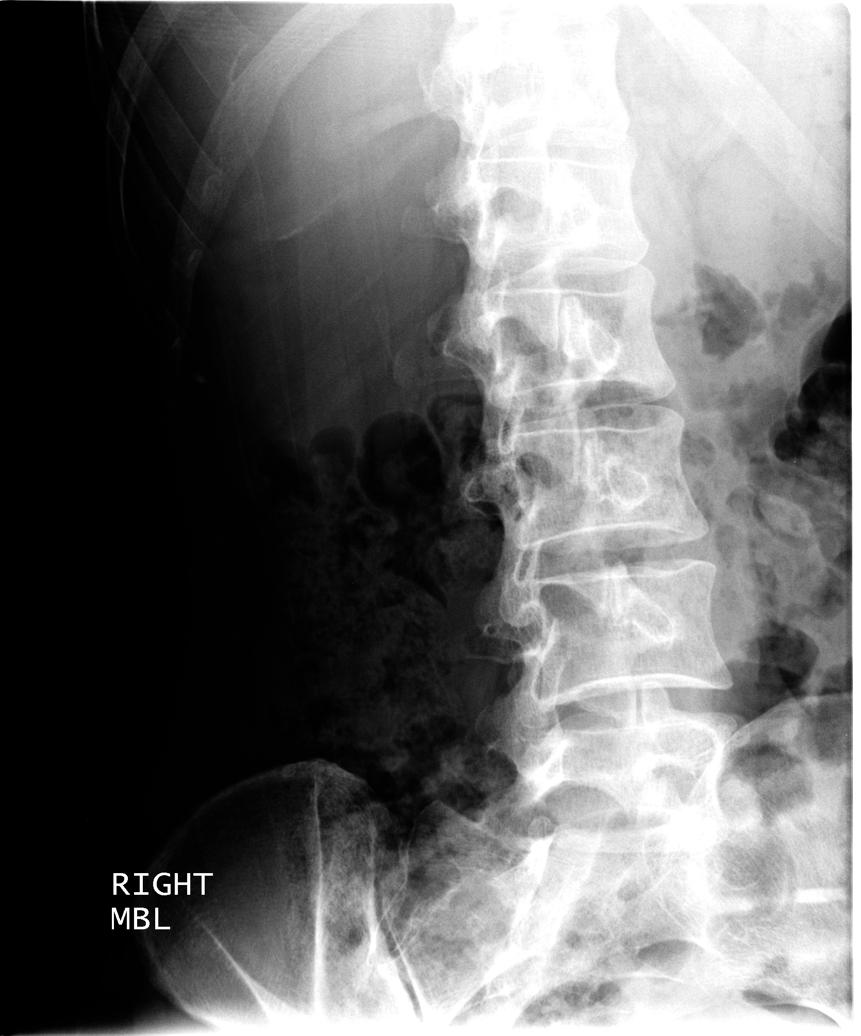

[view not recorded (5 of 6)]
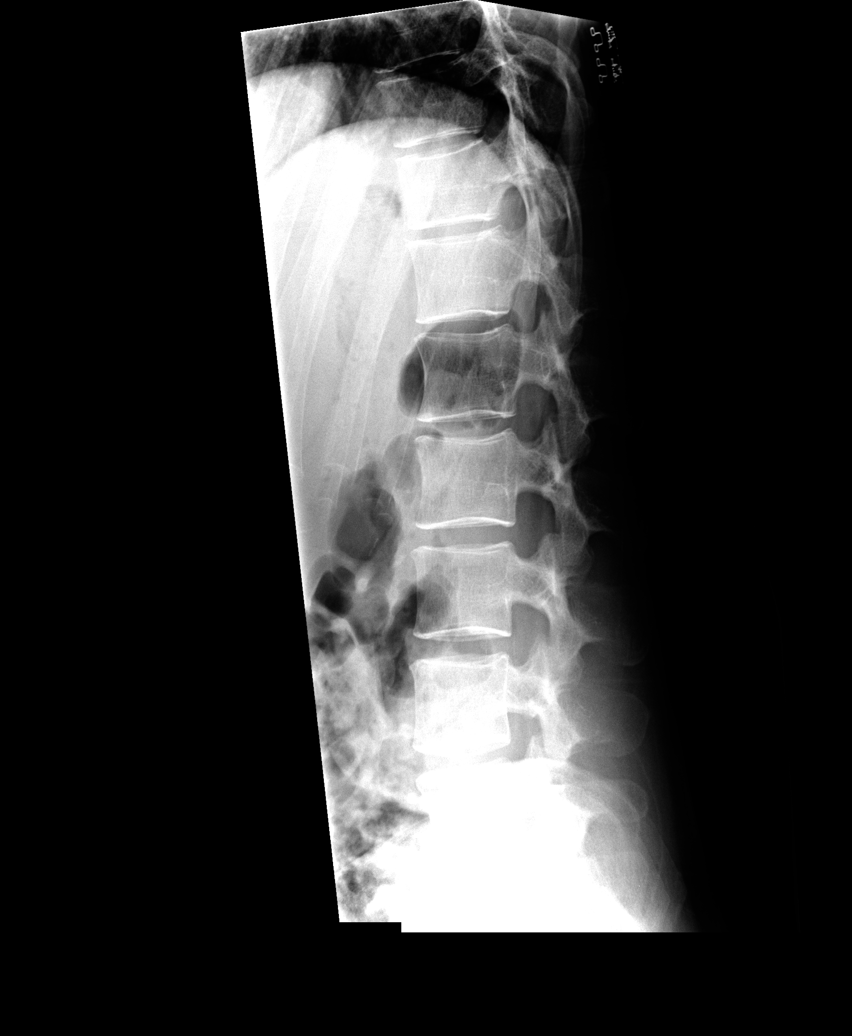

[view not recorded (6 of 6)]
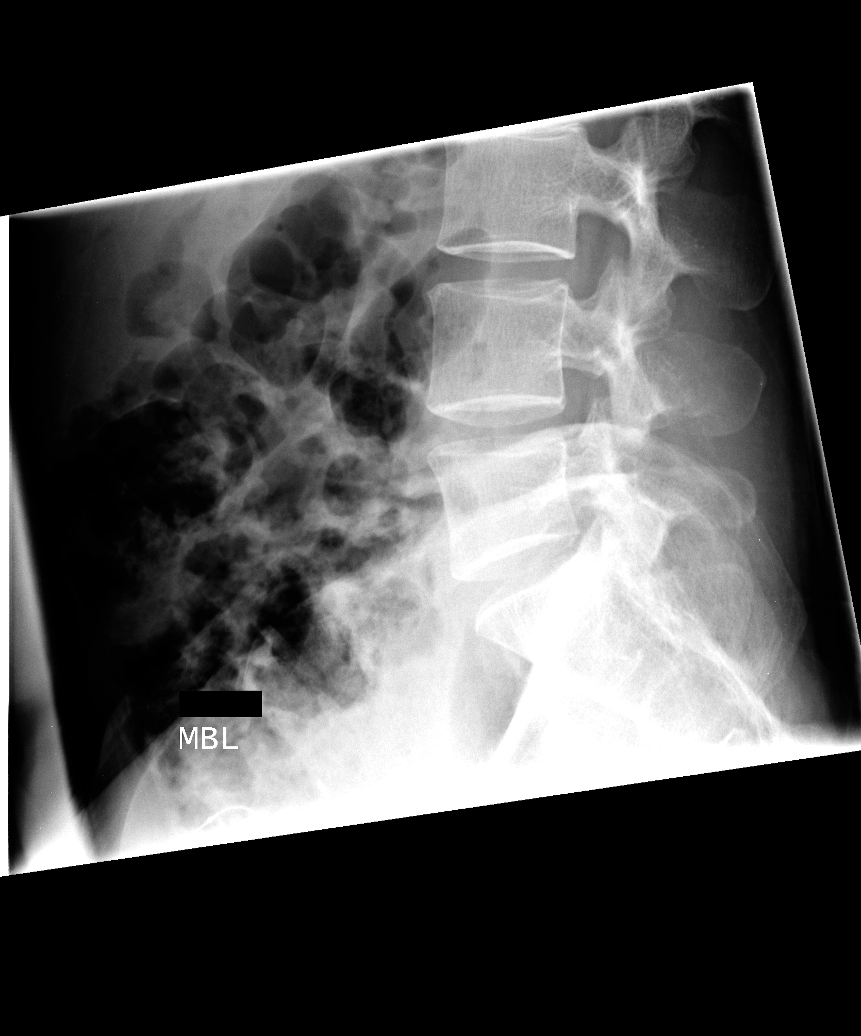

[6 of 6 positions shown; findings below may reference images not displayed]

FINDINGS: There are five non-rib bearing vertebral bodies.  There
is normal alignment.  There is no evidence for acute fracture or
subluxation.  No spondylolysis or spondylolisthesis identified. No
significant degenerative changes identified. The visualized portion
of the pelvis has a normal appearance.  Visualized bowel gas
pattern is nonobstructive.

Note is made of bilateral Essure devices.
IMPRESSION: Negative exam.

## 2012-12-19 ENCOUNTER — Emergency Department (HOSPITAL_COMMUNITY): Payer: BC Managed Care – PPO

## 2012-12-19 ENCOUNTER — Emergency Department (HOSPITAL_COMMUNITY)
Admission: EM | Admit: 2012-12-19 | Discharge: 2012-12-20 | Disposition: A | Discharge status: Home / Self Care | Payer: BC Managed Care – PPO | Attending: Emergency Medicine | Admitting: Emergency Medicine

## 2012-12-19 ENCOUNTER — Encounter (HOSPITAL_COMMUNITY): Payer: Self-pay | Admitting: Emergency Medicine

## 2012-12-19 DIAGNOSIS — R059 Cough, unspecified: Secondary | ICD-10-CM | POA: Insufficient documentation

## 2012-12-19 DIAGNOSIS — F17200 Nicotine dependence, unspecified, uncomplicated: Secondary | ICD-10-CM | POA: Insufficient documentation

## 2012-12-19 DIAGNOSIS — R05 Cough: Secondary | ICD-10-CM | POA: Insufficient documentation

## 2012-12-19 DIAGNOSIS — Z79899 Other long term (current) drug therapy: Secondary | ICD-10-CM | POA: Insufficient documentation

## 2012-12-19 DIAGNOSIS — M549 Dorsalgia, unspecified: Secondary | ICD-10-CM | POA: Insufficient documentation

## 2012-12-19 DIAGNOSIS — R0602 Shortness of breath: Secondary | ICD-10-CM | POA: Insufficient documentation

## 2012-12-19 DIAGNOSIS — R062 Wheezing: Secondary | ICD-10-CM | POA: Insufficient documentation

## 2012-12-19 DIAGNOSIS — G8929 Other chronic pain: Secondary | ICD-10-CM | POA: Insufficient documentation

## 2012-12-19 DIAGNOSIS — Z8701 Personal history of pneumonia (recurrent): Secondary | ICD-10-CM | POA: Insufficient documentation

## 2012-12-19 MED ORDER — ALBUTEROL SULFATE (5 MG/ML) 0.5% IN NEBU
5.0000 mg | INHALATION_SOLUTION | Freq: Once | RESPIRATORY_TRACT | Status: AC
Start: 2012-12-19 — End: 2012-12-19
  Administered 2012-12-19: 5 mg via RESPIRATORY_TRACT
  Filled 2012-12-19: qty 1

## 2012-12-19 MED ORDER — PREDNISONE 20 MG PO TABS
60.0000 mg | ORAL_TABLET | Freq: Once | ORAL | Status: AC
Start: 2012-12-19 — End: 2012-12-19
  Administered 2012-12-19: 60 mg via ORAL
  Filled 2012-12-19: qty 3

## 2012-12-19 MED ORDER — IPRATROPIUM BROMIDE 0.02 % IN SOLN
0.5000 mg | Freq: Once | RESPIRATORY_TRACT | Status: AC
  Administered 2012-12-19: 0.5 mg via RESPIRATORY_TRACT
  Filled 2012-12-19: qty 2.5

## 2012-12-19 NOTE — ED Notes (Signed)
Pt was having back pain starting 1 week ago. C/o productive cough, and states that she feels as if she has fluid in her lungs.

## 2012-12-19 NOTE — ED Provider Notes (Signed)
Medical screening examination/treatment/procedure(s) were conducted as a shared visit with non-physician practitioner(s) and myself.  I personally evaluated the patient during the encounter  37 year old female has a history of tobacco abuse and substance abuse including IV drug abuse, presents with several days of cough nasal congestion and some intermittent paralumbar positional pain which is not in the midline nonradiating and without associated weakness numbness or change in bowel or bladder function and no fever no trauma, she does not really feel short of breath but does feel as if she has congestion in her chest and lungs just like prior pneumonia. Examination reveals no midline back tenderness she does have reproducible minimal paralumbar muscular type soft tissue tenderness without rash, lungs show diffuse wheezes and rhonchi with no retractions no accessory muscle usage and pulse oximetry is normal on room air 95%.  Hurman Horn, MD 12/23/12 2037

## 2012-12-20 MED ORDER — ALBUTEROL SULFATE HFA 108 (90 BASE) MCG/ACT IN AERS: 2.0000 | INHALATION_SPRAY | Freq: Four times a day (QID) | RESPIRATORY_TRACT | Status: DC | PRN

## 2012-12-20 MED ORDER — PREDNISONE 20 MG PO TABS: ORAL_TABLET | ORAL | Status: DC

## 2012-12-20 NOTE — ED Provider Notes (Signed)
CSN: [id number]     Arrival date & time 12/19/12  2054 History   First MD Initiated Contact with Patient 12/19/12 2241     No chief complaint on file.  (Consider location/radiation/quality/duration/timing/severity/associated sxs/prior Treatment) HPI Comments: Patient is a 37 year old female who presents today with productive, nonbloody cough. This gradually worsening for the past week. She is bringing up a yellow mucus. She reports she feels exactly the way she did when she had pneumonia in the past. She denies any pleuritic chest pain. No history of DVT or PE. No recent travel, recent surgeries, leg swelling, exogenous estrogen. Also reports she has been having intermittent back pain. She has chronic back pain on Suboxone. Her back pain has been worsening over the past week. She reports that currently her back pain is not severe and has improved from what it was. Pain is positional. No trauma. Initially she denied IV drug abuse, but later revealed that she has used IV drugs in the past month. No bowel or bladder incontinence, history of cancer. She denies fevers, chills, nausea, vomiting, abdominal pain, chest pain.  The history is provided by the patient. No language interpreter was used.    Past Medical History  Diagnosis Date  . Substance dependence     on suboxone   Past Surgical History  Procedure Laterality Date  . Abdominal surgery     History reviewed. No pertinent family history. History  Substance Use Topics  . Smoking status: Current Every Day Smoker -- 0.50 packs/day    Types: Cigarettes  . Smokeless tobacco: Not on file  . Alcohol Use: No   OB History   Grav Para Term Preterm Abortions TAB SAB Ect Mult Living                 Review of Systems  Constitutional: Negative for fever and chills.  Respiratory: Positive for cough and shortness of breath.   Cardiovascular: Negative for chest pain.  Gastrointestinal: Negative for nausea, vomiting and abdominal pain.    Musculoskeletal: Positive for back pain.  Neurological: Negative for weakness and numbness.  All other systems reviewed and are negative.    Allergies  Erythromycin  Home Medications   Current Outpatient Rx  Name  Route  Sig  Dispense  Refill  . ALPRAZolam (XANAX) 1 MG tablet   Oral   Take 0.5 mg by mouth 2 (two) times daily as needed for anxiety.         Marland Kitchen amphetamine-dextroamphetamine (ADDERALL) 20 MG tablet   Oral   Take 20 mg by mouth every morning.         . buprenorphine-naloxone (SUBOXONE) 8-2 MG SUBL   Sublingual   Place 1 tablet under the tongue every morning.          Marland Kitchen ibuprofen (ADVIL,MOTRIN) 200 MG tablet   Oral   Take 400 mg by mouth every 6 (six) hours as needed for pain.         Marland Kitchen albuterol (PROVENTIL HFA;VENTOLIN HFA) 108 (90 BASE) MCG/ACT inhaler   Inhalation   Inhale 2 puffs into the lungs every 6 (six) hours as needed for wheezing.   1 Inhaler   2   . predniSONE (DELTASONE) 20 MG tablet      3 tabs po day one, then 2 po daily x 4 days   11 tablet   0    BP 112/65  Pulse 89  Temp(Src) 97.8 F (36.6 C) (Oral)  Resp 18  Ht 5\' 4"  (1.626  m)  Wt 118 lb (53.524 kg)  BMI 20.24 kg/m2  SpO2 98%  LMP 12/01/2012 Physical Exam  Nursing note and vitals reviewed. Constitutional: She is oriented to person, place, and time. She appears well-developed and well-nourished. No distress.  HENT:  Head: Normocephalic and atraumatic.  Right Ear: External ear normal.  Left Ear: External ear normal.  Nose: Nose normal.  Mouth/Throat: Oropharynx is clear and moist.  Eyes: Conjunctivae are normal.  Neck: Normal range of motion.  Cardiovascular: Normal rate, regular rhythm, normal heart sounds, intact distal pulses and normal pulses.   Pulmonary/Chest: Effort normal. No accessory muscle usage or stridor. Not tachypneic. No respiratory distress. She has wheezes. She has rhonchi. She has no rales.  Abdominal: Soft. She exhibits no distension.   Musculoskeletal: Normal range of motion.       Lumbar back: She exhibits tenderness and pain. She exhibits normal range of motion, no bony tenderness and no deformity.       Back:  Pt sitting with legs crossed in no distress  Neurological: She is alert and oriented to person, place, and time. She has normal strength.  Skin: Skin is warm and dry. She is not diaphoretic. No erythema.  Psychiatric: She has a normal mood and affect. Her behavior is normal.    ED Course  Procedures (including critical care time) Labs Review Labs Reviewed - No data to display Imaging Review Dg Chest 2 View  12/19/2012   *RADIOLOGY REPORT*  Clinical Data: Short of breath  CHEST - 2 VIEW  Comparison: Prior chest x-ray 11/26/2011  Findings: Improving left lower lobe opacity with persistent linear atelectasis versus scarring in the lingula.  Background COPD and emphysema.  No new focal airspace consolidation.  No acute osseous abnormality.  Cardiac and mediastinal contours remain within normal limits.  IMPRESSION:  1.  No acute cardiopulmonary process. 2.  Interval resolution of left lower lobe opacity with persistent atelectasis versus scarring in the lingula. 3.  Background changes suggest underlying emphysema and COPD.   Original Report Authenticated By: Malachy Moan, M.D.    MDM   1. Cough    Patient presents to the emergency department with cough and shortness of breath. She has wheezes and rhonchi diffusely through her lungs. X-rays negative for acute infiltrate. She has underlying emphysema or COPD. The patient received a nebulizer treatment and 60 mg of prednisone in the emergency department. She felt significantly improved after the breathing treatment. She was sent home with a 5 day burst of prednisone as well as albuterol inhaler. She does have issues with IV drug abuse, but is afebrile today. No midline tenderness or radiation of her back pain. Return instructions were given. Vital signs stable for  discharge. Dr. Fonnie Jarvis evaluated this patient and agrees with plan. Patient / Family / Caregiver informed of clinical course, understand medical decision-making process, and agree with plan.    Mora Bellman, PA-C 12/20/12 [phone]

## 2013-03-20 ENCOUNTER — Telehealth: Payer: Self-pay

## 2013-03-20 NOTE — Telephone Encounter (Signed)
Pt called and wants to know if she comes in for a drug screen if her Suboxon will show up as heroin. She needs the drug screen for family/personal reasons but only wants to prove that she is not on heroin. Please call [phone]

## 2013-03-21 NOTE — Telephone Encounter (Signed)
Drug screen would be able to tell suboxone from heroin. Advised her to come in for this and we will see her and do the testing, sent to the lab, this takes 2 days.

## 2013-06-09 ENCOUNTER — Encounter (HOSPITAL_COMMUNITY): Payer: Self-pay | Admitting: Emergency Medicine

## 2013-06-09 ENCOUNTER — Emergency Department (HOSPITAL_COMMUNITY)
Admission: EM | Admit: 2013-06-09 | Discharge: 2013-06-10 | Disposition: A | Discharge status: Home / Self Care | Payer: BC Managed Care – PPO | Attending: Emergency Medicine | Admitting: Emergency Medicine

## 2013-06-09 DIAGNOSIS — Z79899 Other long term (current) drug therapy: Secondary | ICD-10-CM | POA: Insufficient documentation

## 2013-06-09 DIAGNOSIS — Z008 Encounter for other general examination: Secondary | ICD-10-CM | POA: Insufficient documentation

## 2013-06-09 DIAGNOSIS — F17200 Nicotine dependence, unspecified, uncomplicated: Secondary | ICD-10-CM | POA: Insufficient documentation

## 2013-06-09 DIAGNOSIS — Z139 Encounter for screening, unspecified: Secondary | ICD-10-CM
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
## 2013-06-09 NOTE — ED Notes (Signed)
Pt brought in by [employer] under IVC  Paperwork states the pt has been abusing drugs, is paranoid, and is not attending to her children appropriately

## 2013-06-10 ENCOUNTER — Encounter (HOSPITAL_COMMUNITY): Payer: Self-pay | Admitting: Emergency Medicine

## 2013-06-10 ENCOUNTER — Emergency Department (HOSPITAL_COMMUNITY)
Admission: EM | Admit: 2013-06-10 | Discharge: 2013-06-10 | Disposition: A | Discharge status: Home / Self Care | Payer: BC Managed Care – PPO | Attending: Emergency Medicine | Admitting: Emergency Medicine

## 2013-06-10 DIAGNOSIS — Z046 Encounter for general psychiatric examination, requested by authority: Secondary | ICD-10-CM | POA: Insufficient documentation

## 2013-06-10 DIAGNOSIS — Z79899 Other long term (current) drug therapy: Secondary | ICD-10-CM | POA: Insufficient documentation

## 2013-06-10 DIAGNOSIS — Z008 Encounter for other general examination: Secondary | ICD-10-CM

## 2013-06-10 DIAGNOSIS — F17200 Nicotine dependence, unspecified, uncomplicated: Secondary | ICD-10-CM | POA: Insufficient documentation
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
## 2013-06-10 NOTE — Discharge Instructions (Signed)
You may return to the ER if symptoms worsen or you have any other concerns.  °  °

## 2013-06-10 NOTE — ED Provider Notes (Signed)
Medical screening examination/treatment/procedure(s) were performed by non-physician practitioner and as supervising physician I was immediately available for consultation/collaboration.   EKG Interpretation None       Derwood KaplanAnkit Vasti Yagi, MD 06/10/13 [phone]
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
## 2013-06-11 NOTE — ED Provider Notes (Signed)
Medical screening examination/treatment/procedure(s) were performed by non-physician practitioner and as supervising physician I was immediately available for consultation/collaboration.    Aliscia Clayton L Jerrye Seebeck, MD 06/11/13 1945 

## 2014-01-12 ENCOUNTER — Ambulatory Visit (INDEPENDENT_AMBULATORY_CARE_PROVIDER_SITE_OTHER): Payer: BC Managed Care – PPO

## 2014-01-12 ENCOUNTER — Ambulatory Visit (INDEPENDENT_AMBULATORY_CARE_PROVIDER_SITE_OTHER): Payer: BC Managed Care – PPO | Admitting: Family Medicine

## 2014-01-12 VITALS — BP 120/72 | HR 110 | Temp 99.6°F | Resp 16 | Ht 64.0 in | Wt 131.0 lb

## 2014-01-12 DIAGNOSIS — F1911 Other psychoactive substance abuse, in remission: Secondary | ICD-10-CM | POA: Insufficient documentation

## 2014-01-12 DIAGNOSIS — B199 Unspecified viral hepatitis without hepatic coma: Secondary | ICD-10-CM | POA: Insufficient documentation

## 2014-01-12 DIAGNOSIS — R05 Cough: Secondary | ICD-10-CM

## 2014-01-12 DIAGNOSIS — R062 Wheezing: Secondary | ICD-10-CM

## 2014-01-12 DIAGNOSIS — R059 Cough, unspecified: Secondary | ICD-10-CM

## 2014-01-12 DIAGNOSIS — J209 Acute bronchitis, unspecified: Secondary | ICD-10-CM

## 2014-01-12 DIAGNOSIS — F1910 Other psychoactive substance abuse, uncomplicated: Secondary | ICD-10-CM

## 2014-01-12 DIAGNOSIS — B1920 Unspecified viral hepatitis C without hepatic coma: Secondary | ICD-10-CM

## 2014-01-12 LAB — POCT INFLUENZA A/B
INFLUENZA A, POC: NEGATIVE
Influenza B, POC: NEGATIVE

## 2014-01-12 MED ORDER — ALBUTEROL SULFATE (2.5 MG/3ML) 0.083% IN NEBU
2.5000 mg | INHALATION_SOLUTION | Freq: Once | RESPIRATORY_TRACT | Status: AC
  Administered 2014-01-12: 2.5 mg via RESPIRATORY_TRACT

## 2014-01-12 MED ORDER — AZITHROMYCIN 250 MG PO TABS: ORAL_TABLET | ORAL | Status: DC

## 2014-01-12 NOTE — Progress Notes (Signed)
Subjective:    Patient ID: Kayla Reese, female    DOB: 09/06/1975, 38 y.o.   MRN: [id number]  01/12/2014  Illness   HPI This 38 y.o. female presents for evaluation of cough. Onset yesterday.  No fever but +chills/sweats.  +HA.  No ear pain.  No sore throat.  +glands swollen.  +rhinorrhea +nasal congestion +really bad cough; +tobacco.  No SOB.  No wheezing.  No v/d.  No asthma hx.  No flu vaccine.  +myalgias.  Mucinex and vitamin C.  Hairstylist.  History of recurrent walking pneumonia; symptoms feel very similar.  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  Recently tested + for Hepatitis C; heard that must be clean for six months to be evaluated by hepatitis specialist.  PCP: none   Review of Systems  Constitutional: Positive for chills and diaphoresis. Negative for fever and fatigue.  HENT: Positive for congestion and rhinorrhea. Negative for ear pain and sore throat.   Respiratory: Positive for cough. Negative for shortness of breath and wheezing.   Gastrointestinal: Negative for nausea, vomiting and diarrhea.  Skin: Negative for rash.  Neurological: Positive for headaches.    Past Medical History  Diagnosis Date  . Substance dependence     on suboxone  . Hepatitis C    Past Surgical History  Procedure Laterality Date  . Abdominal surgery    . Cesarean section    . Arm surgery    . Novasure ablation    . Tubal ligation     Allergies  Allergen Reactions  . Erythromycin Rash   Current Outpatient Prescriptions  Medication Sig Dispense Refill  . amphetamine-dextroamphetamine (ADDERALL) 20 MG tablet Take 20 mg by mouth every morning.      . buprenorphine-naloxone (SUBOXONE) 8-2 MG SUBL SL tablet Place 1 tablet under the tongue daily.      Marland Kitchen. azithromycin (ZITHROMAX) 250 MG tablet Two tablets daily x 1 day then one tablet daily x 4 days  6 tablet  0   No current facility-administered medications for this visit.       Objective:    BP 120/72   Pulse 110  Temp(Src) 99.6 F (37.6 C)  Resp 16  Ht 5\' 4"  (1.626 m)  Wt 131 lb (59.421 kg)  BMI 22.47 kg/m2  SpO2 97%  LMP 12/01/2013 Physical Exam  Nursing note and vitals reviewed. Constitutional: She is oriented to person, place, and time. She appears well-developed and well-nourished. No distress.  HENT:  Head: Normocephalic and atraumatic.  Right Ear: External ear normal. Tympanic membrane is erythematous.  Left Ear: External ear normal. Tympanic membrane is erythematous.  Nose: Nose normal.  Mouth/Throat: Oropharynx is clear and moist. No oropharyngeal exudate.  Eyes: Conjunctivae and EOM are normal. Pupils are equal, round, and reactive to light.  Neck: Normal range of motion. Neck supple.  Cardiovascular: Normal rate, regular rhythm and normal heart sounds.  Exam reveals no gallop and no friction rub.   No murmur heard. Pulmonary/Chest: Effort normal. She has wheezes in the right middle field, the right lower field, the left middle field and the left lower field. She has no rhonchi. She has no rales.  Lymphadenopathy:    She has no cervical adenopathy.  Neurological: She is alert and oriented to person, place, and time.  Skin: She is not diaphoretic.  Linear ecchymoses along B forearms.  Linear scarring B forearms.  Psychiatric: She has a normal mood and affect. Her behavior is normal.   Results for  orders placed in visit on 01/12/14  POCT INFLUENZA A/B      Result Value Ref Range   Influenza A, POC Negative     Influenza B, POC Negative     [facility] reading (PRIMARY) by  Dr. Katrinka BlazingSmith.  CXR: NAD  ALBUTEROL NEBULIZER ADMINISTERED IN OFFICE.     Assessment & Plan:   1. Cough   2. Wheezing   3. Acute bronchitis, unspecified organism   4. Substance abuse in remission     1. URI/acute bronchitis:  New.  With history of pneumonia.  Rx for Zithromax provided due to concern for active drug use.  Continue Mucinex DM.  Recommend Afrin bid for 5 days only. 2.  Bronchospasm:   New.  S/p Albuterol nebulizer in office.  May warrant prednisone if cough persists. 3.  Substance abuse/Heroine reported to be in remission: reports remission with suboxone use however has active linear bruising on B forearms to suggest recent use. 4. Hepatitis C: New diagnosis for patient; discussed treatment options; pt to clarify if has had a viral load.  Meds ordered this encounter  Medications  . albuterol (PROVENTIL) (2.5 MG/3ML) 0.083% nebulizer solution 2.5 mg    Sig:   . azithromycin (ZITHROMAX) 250 MG tablet    Sig: Two tablets daily x 1 day then one tablet daily x 4 days    Dispense:  6 tablet    Refill:  0    No Follow-up on file.    Nilda SimmerKristi Emmry Hinsch, M.D.  [facility]  [facility] [address] [place], [place]  [id number] [phone] phone [phone] fax

## 2014-01-13 ENCOUNTER — Telehealth: Payer: Self-pay | Admitting: Physician Assistant

## 2014-01-13 NOTE — Telephone Encounter (Addendum)
Call report on CXR performed yesterday with over read significantly different from primary read.  Linear opacity which could reflect pneumonia, scarring/atelectasis; similar to 12/19/2012.  OV reviewed. Patient started on azithromycin for presumed bacterial infection.

## 2014-01-16 NOTE — Telephone Encounter (Signed)
Noted and report reviewed; no further action warranted at this time.

## 2015-01-01 ENCOUNTER — Encounter (HOSPITAL_COMMUNITY): Payer: Self-pay | Admitting: *Deleted

## 2015-01-01 ENCOUNTER — Emergency Department (HOSPITAL_COMMUNITY): Payer: Self-pay

## 2015-01-01 ENCOUNTER — Emergency Department (HOSPITAL_COMMUNITY)
Admission: EM | Admit: 2015-01-01 | Discharge: 2015-01-01 | Disposition: A | Discharge status: Home / Self Care | Payer: Self-pay | Attending: Emergency Medicine | Admitting: Emergency Medicine

## 2015-01-01 DIAGNOSIS — S2231XA Fracture of one rib, right side, initial encounter for closed fracture: Secondary | ICD-10-CM

## 2015-01-01 DIAGNOSIS — Z720 Tobacco use: Secondary | ICD-10-CM | POA: Insufficient documentation

## 2015-01-01 DIAGNOSIS — F1310 Sedative, hypnotic or anxiolytic abuse, uncomplicated: Secondary | ICD-10-CM | POA: Insufficient documentation

## 2015-01-01 DIAGNOSIS — F1110 Opioid abuse, uncomplicated: Secondary | ICD-10-CM | POA: Insufficient documentation

## 2015-01-01 DIAGNOSIS — T1490XA Injury, unspecified, initial encounter: Secondary | ICD-10-CM

## 2015-01-01 DIAGNOSIS — S2241XA Multiple fractures of ribs, right side, initial encounter for closed fracture: Secondary | ICD-10-CM | POA: Insufficient documentation

## 2015-01-01 DIAGNOSIS — F1410 Cocaine abuse, uncomplicated: Secondary | ICD-10-CM | POA: Insufficient documentation

## 2015-01-01 DIAGNOSIS — Z590 Homelessness: Secondary | ICD-10-CM | POA: Insufficient documentation

## 2015-01-01 DIAGNOSIS — Y9289 Other specified places as the place of occurrence of the external cause: Secondary | ICD-10-CM | POA: Insufficient documentation

## 2015-01-01 DIAGNOSIS — Y9389 Activity, other specified: Secondary | ICD-10-CM | POA: Insufficient documentation

## 2015-01-01 DIAGNOSIS — Z8619 Personal history of other infectious and parasitic diseases: Secondary | ICD-10-CM | POA: Insufficient documentation

## 2015-01-01 DIAGNOSIS — S8011XA Contusion of right lower leg, initial encounter: Secondary | ICD-10-CM | POA: Insufficient documentation

## 2015-01-01 DIAGNOSIS — F1010 Alcohol abuse, uncomplicated: Secondary | ICD-10-CM | POA: Insufficient documentation

## 2015-01-01 DIAGNOSIS — Y998 Other external cause status: Secondary | ICD-10-CM | POA: Insufficient documentation

## 2015-01-01 LAB — URINALYSIS, ROUTINE W REFLEX MICROSCOPIC
BILIRUBIN URINE: NEGATIVE
Glucose, UA: NEGATIVE mg/dL
Hgb urine dipstick: NEGATIVE
Ketones, ur: NEGATIVE mg/dL
LEUKOCYTES UA: NEGATIVE
NITRITE: NEGATIVE
Protein, ur: NEGATIVE mg/dL
SPECIFIC GRAVITY, URINE: 1.019 (ref 1.005–1.030)
UROBILINOGEN UA: 1 mg/dL (ref 0.0–1.0)
pH: 6.5 (ref 5.0–8.0)

## 2015-01-01 LAB — CBC
HCT: 41.9 % (ref 36.0–46.0)
Hemoglobin: 13.8 g/dL (ref 12.0–15.0)
MCH: 31.4 pg (ref 26.0–34.0)
MCHC: 32.9 g/dL (ref 30.0–36.0)
MCV: 95.2 fL (ref 78.0–100.0)
PLATELETS: 345 10*3/uL (ref 150–400)
RBC: 4.4 MIL/uL (ref 3.87–5.11)
RDW: 13.8 % (ref 11.5–15.5)
WBC: 8.1 10*3/uL (ref 4.0–10.5)

## 2015-01-01 LAB — ETHANOL: ALCOHOL ETHYL (B): 13 mg/dL — AB (ref <5)

## 2015-01-01 LAB — COMPREHENSIVE METABOLIC PANEL
ALT: 65 U/L — ABNORMAL HIGH (ref 14–54)
ANION GAP: 8 (ref 5–15)
AST: 68 U/L — ABNORMAL HIGH (ref 15–41)
Albumin: 4 g/dL (ref 3.5–5.0)
Alkaline Phosphatase: 92 U/L (ref 38–126)
BILIRUBIN TOTAL: 0.7 mg/dL (ref 0.3–1.2)
BUN: 15 mg/dL (ref 6–20)
CO2: 31 mmol/L (ref 22–32)
Calcium: 9.1 mg/dL (ref 8.9–10.3)
Chloride: 104 mmol/L (ref 101–111)
Creatinine, Ser: 0.97 mg/dL (ref 0.44–1.00)
Glucose, Bld: 97 mg/dL (ref 65–99)
POTASSIUM: 3.3 mmol/L — AB (ref 3.5–5.1)
Sodium: 143 mmol/L (ref 135–145)
TOTAL PROTEIN: 8.3 g/dL — AB (ref 6.5–8.1)

## 2015-01-01 LAB — RAPID URINE DRUG SCREEN, HOSP PERFORMED
Amphetamines: NOT DETECTED
Barbiturates: NOT DETECTED
Benzodiazepines: POSITIVE — AB
Cocaine: POSITIVE — AB
OPIATES: POSITIVE — AB
TETRAHYDROCANNABINOL: NOT DETECTED

## 2015-01-01 LAB — ACETAMINOPHEN LEVEL

## 2015-01-01 LAB — SALICYLATE LEVEL

## 2015-01-01 MED ORDER — ACETAMINOPHEN 500 MG PO TABS: 500.0000 mg | ORAL_TABLET | Freq: Four times a day (QID) | ORAL | Status: DC | PRN

## 2015-01-01 MED ORDER — IBUPROFEN 800 MG PO TABS: 800.0000 mg | ORAL_TABLET | Freq: Three times a day (TID) | ORAL | Status: DC

## 2015-01-01 MED ORDER — HYDROCODONE-ACETAMINOPHEN 5-325 MG PO TABS
1.0000 | ORAL_TABLET | Freq: Once | ORAL | Status: AC
  Administered 2015-01-01: 1 via ORAL
  Filled 2015-01-01: qty 1

## 2015-01-01 MED ORDER — IBUPROFEN 800 MG PO TABS
800.0000 mg | ORAL_TABLET | Freq: Once | ORAL | Status: AC
  Administered 2015-01-01: 800 mg via ORAL
  Filled 2015-01-01: qty 1

## 2015-01-01 NOTE — Progress Notes (Signed)
CSW was notified by Nurse CM that patient is homeless.   CSW met with patient who confirms that she is homeless. CSW offered patient resources. Patient politely declined. Patient states that she already has resources for shelters and food. CSW offered to call local shelter. Patient states that she does not need a shelter tonight, due to friends being able to let her stay for the night.  Patient informed CSW that she does not have any questions for CSW.  Willette Brace [phone] ED CSW 01/01/2015 7:33 PM

## 2015-01-01 NOTE — ED Notes (Signed)
Awake. Verbally responsive. A/O x4. Resp even and unlabored. No audible adventitious breath sounds noted. ABC's intact.  
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
## 2015-01-01 NOTE — ED Notes (Signed)
Pt aware of urine sample 

## 2015-01-01 NOTE — Discharge Instructions (Signed)
Rib Fracture °A rib fracture is a break or crack in one of the bones of the ribs. The ribs are like a cage that goes around your upper chest. A broken or cracked rib is often painful, but most do not cause other problems. Most rib fractures heal on their own in 1-3 months. °HOME CARE °· Avoid activities that cause pain to the injured area. Protect your injured area. °· Slowly increase activity as told by your doctor. °· Take medicine as told by your doctor. °· Put ice on the injured area for the first 1-2 days after you have been treated or as told by your doctor. °¨ Put ice in a plastic bag. °¨ Place a towel between your skin and the bag. °¨ Leave the ice on for 15-20 minutes at a time, every 2 hours while you are awake. °· Do deep breathing as told by your doctor. You may be told to: °¨ Take deep breaths many times a day. °¨ Cough many times a day while hugging a pillow. °¨ Use a device (incentive spirometer) to perform deep breathing many times a day. °· Drink enough fluids to keep your pee (urine) clear or pale yellow.   °· Do not wear a rib belt or binder. These do not allow you to breathe deeply. °GET HELP RIGHT AWAY IF:  °· You have a fever. °· You have trouble breathing.   °· You cannot stop coughing. °· You cough up thick or bloody spit (mucus).   °· You feel sick to your stomach (nauseous), throw up (vomit), or have belly (abdominal) pain.   °· Your pain gets worse and medicine does not help.   °MAKE SURE YOU:  °· Understand these instructions. °· Will watch your condition. °· Will get help right away if you are not doing well or get worse. °  °This information is not intended to replace advice given to you by your health care provider. Make sure you discuss any questions you have with your health care provider. °  °Document Released: 12/11/2007 Document Revised: 06/28/2012 Document Reviewed: 05/05/2012 °Elsevier Interactive Patient Education ©2016 [employer] ° °
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
## 2015-01-01 NOTE — ED Notes (Signed)
Pt have two bag place inside two Pt belonging bag

## 2015-01-01 NOTE — ED Notes (Signed)
I ATTEMPTED THREE TIMES TO COLLECT LABS AND WAS UNSUCCESSFUL.  I MADE THE NURSE AWARE.
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
## 2015-08-31 ENCOUNTER — Emergency Department (HOSPITAL_COMMUNITY)
Admission: EM | Admit: 2015-08-31 | Discharge: 2015-09-01 | Disposition: A | Discharge status: Home / Self Care | Payer: Self-pay | Attending: Emergency Medicine | Admitting: Emergency Medicine

## 2015-08-31 ENCOUNTER — Encounter (HOSPITAL_COMMUNITY): Payer: Self-pay | Admitting: Emergency Medicine

## 2015-08-31 DIAGNOSIS — F1120 Opioid dependence, uncomplicated: Secondary | ICD-10-CM | POA: Insufficient documentation

## 2015-08-31 DIAGNOSIS — F1920 Other psychoactive substance dependence, uncomplicated: Secondary | ICD-10-CM | POA: Diagnosis present

## 2015-08-31 DIAGNOSIS — F1810 Inhalant abuse, uncomplicated: Secondary | ICD-10-CM

## 2015-08-31 DIAGNOSIS — R45851 Suicidal ideations: Secondary | ICD-10-CM | POA: Insufficient documentation

## 2015-08-31 DIAGNOSIS — F17210 Nicotine dependence, cigarettes, uncomplicated: Secondary | ICD-10-CM | POA: Insufficient documentation

## 2015-08-31 NOTE — ED Notes (Addendum)
Pts mother Filbert SchilderGail Bruning [phone] called. Pt gave permission for staff to discuss care with mother. Mother states patient has not been "coherant" all week, she believes she has been using duster heavily all week.

## 2015-08-31 NOTE — ED Notes (Signed)
Pt from home stating she overdosed on opiates and has "been huffing that can stuff". Pt is unable to tell me what time she took any pills or what time she took them or what pills she took. Pt is anxious and yelling at time of assessment. Pt states she did this in attempt to get high, but not to self harm.  Pt guesses maybe 6 percocet pills and 50 cans of duster

## 2015-09-01 ENCOUNTER — Observation Stay (HOSPITAL_COMMUNITY)
Admission: AD | Admit: 2015-09-01 | Discharge: 2015-09-02 | Disposition: A | Discharge status: Home / Self Care | Payer: No Typology Code available for payment source | Source: Intra-hospital | Attending: Psychiatry | Admitting: Psychiatry

## 2015-09-01 ENCOUNTER — Encounter (HOSPITAL_COMMUNITY): Payer: Self-pay | Admitting: *Deleted

## 2015-09-01 ENCOUNTER — Emergency Department (HOSPITAL_COMMUNITY): Payer: Self-pay

## 2015-09-01 DIAGNOSIS — F1420 Cocaine dependence, uncomplicated: Principal | ICD-10-CM | POA: Insufficient documentation

## 2015-09-01 DIAGNOSIS — F1520 Other stimulant dependence, uncomplicated: Secondary | ICD-10-CM | POA: Insufficient documentation

## 2015-09-01 DIAGNOSIS — F17210 Nicotine dependence, cigarettes, uncomplicated: Secondary | ICD-10-CM | POA: Insufficient documentation

## 2015-09-01 DIAGNOSIS — B1920 Unspecified viral hepatitis C without hepatic coma: Secondary | ICD-10-CM | POA: Insufficient documentation

## 2015-09-01 DIAGNOSIS — F1120 Opioid dependence, uncomplicated: Secondary | ICD-10-CM | POA: Insufficient documentation

## 2015-09-01 DIAGNOSIS — Z881 Allergy status to other antibiotic agents status: Secondary | ICD-10-CM | POA: Insufficient documentation

## 2015-09-01 DIAGNOSIS — F14188 Cocaine abuse with other cocaine-induced disorder: Secondary | ICD-10-CM | POA: Diagnosis present

## 2015-09-01 DIAGNOSIS — F1020 Alcohol dependence, uncomplicated: Secondary | ICD-10-CM | POA: Insufficient documentation

## 2015-09-01 DIAGNOSIS — F1920 Other psychoactive substance dependence, uncomplicated: Secondary | ICD-10-CM

## 2015-09-01 LAB — CBC
HEMATOCRIT: 45.2 % (ref 36.0–46.0)
HEMOGLOBIN: 15.9 g/dL — AB (ref 12.0–15.0)
MCH: 31 pg (ref 26.0–34.0)
MCHC: 35.2 g/dL (ref 30.0–36.0)
MCV: 88.1 fL (ref 78.0–100.0)
Platelets: 275 10*3/uL (ref 150–400)
RBC: 5.13 MIL/uL — ABNORMAL HIGH (ref 3.87–5.11)
RDW: 12.7 % (ref 11.5–15.5)
WBC: 8.9 10*3/uL (ref 4.0–10.5)

## 2015-09-01 LAB — RAPID URINE DRUG SCREEN, HOSP PERFORMED
AMPHETAMINES: NOT DETECTED
Barbiturates: NOT DETECTED
Benzodiazepines: NOT DETECTED
Cocaine: NOT DETECTED
Opiates: NOT DETECTED
Tetrahydrocannabinol: NOT DETECTED

## 2015-09-01 LAB — COMPREHENSIVE METABOLIC PANEL
ALT: 68 U/L — AB (ref 14–54)
AST: 40 U/L (ref 15–41)
Albumin: 4.7 g/dL (ref 3.5–5.0)
Alkaline Phosphatase: 64 U/L (ref 38–126)
Anion gap: 11 (ref 5–15)
BILIRUBIN TOTAL: 1.1 mg/dL (ref 0.3–1.2)
BUN: 7 mg/dL (ref 6–20)
CALCIUM: 9.3 mg/dL (ref 8.9–10.3)
CO2: 26 mmol/L (ref 22–32)
CREATININE: 0.73 mg/dL (ref 0.44–1.00)
Chloride: 99 mmol/L — ABNORMAL LOW (ref 101–111)
GLUCOSE: 104 mg/dL — AB (ref 65–99)
POTASSIUM: 2.8 mmol/L — AB (ref 3.5–5.1)
Sodium: 136 mmol/L (ref 135–145)
Total Protein: 8.1 g/dL (ref 6.5–8.1)

## 2015-09-01 LAB — CBG MONITORING, ED: GLUCOSE-CAPILLARY: 106 mg/dL — AB (ref 65–99)

## 2015-09-01 LAB — POC URINE PREG, ED: PREG TEST UR: NEGATIVE

## 2015-09-01 LAB — CK: CK TOTAL: 45 U/L (ref 38–234)

## 2015-09-01 LAB — TROPONIN I

## 2015-09-01 LAB — SALICYLATE LEVEL: Salicylate Lvl: <4 mg/dL (ref 2.8–30.0)

## 2015-09-01 LAB — ETHANOL: Alcohol, Ethyl (B): <5 mg/dL (ref <5)

## 2015-09-01 LAB — ACETAMINOPHEN LEVEL

## 2015-09-01 MED ORDER — POTASSIUM CHLORIDE CRYS ER 20 MEQ PO TBCR
40.0000 meq | EXTENDED_RELEASE_TABLET | Freq: Once | ORAL | Status: AC
  Administered 2015-09-01: 40 meq via ORAL
  Filled 2015-09-01: qty 2

## 2015-09-01 MED ORDER — TRAZODONE HCL 50 MG PO TABS
50.0000 mg | ORAL_TABLET | Freq: Every evening | ORAL | Status: DC | PRN
  Administered 2015-09-01: 50 mg via ORAL
  Filled 2015-09-01: qty 1
  Filled 2015-09-01: qty 7

## 2015-09-01 MED ORDER — ALUM & MAG HYDROXIDE-SIMETH 200-200-20 MG/5ML PO SUSP: 30.0000 mL | ORAL | Status: DC | PRN

## 2015-09-01 MED ORDER — HYDROXYZINE HCL 25 MG PO TABS
25.0000 mg | ORAL_TABLET | Freq: Four times a day (QID) | ORAL | Status: DC | PRN
  Administered 2015-09-01 – 2015-09-02 (×2): 25 mg via ORAL
  Filled 2015-09-01: qty 1
  Filled 2015-09-01: qty 10
  Filled 2015-09-01: qty 1

## 2015-09-01 MED ORDER — IBUPROFEN 800 MG PO TABS
800.0000 mg | ORAL_TABLET | Freq: Once | ORAL | Status: AC
  Administered 2015-09-01: 800 mg via ORAL
  Filled 2015-09-01: qty 1

## 2015-09-01 MED ORDER — ACETAMINOPHEN 325 MG PO TABS
650.0000 mg | ORAL_TABLET | Freq: Four times a day (QID) | ORAL | Status: DC | PRN
  Administered 2015-09-01 – 2015-09-02 (×2): 650 mg via ORAL
  Filled 2015-09-01 (×2): qty 2

## 2015-09-01 MED ORDER — MAGNESIUM HYDROXIDE 400 MG/5ML PO SUSP: 30.0000 mL | Freq: Every day | ORAL | Status: DC | PRN

## 2015-09-01 NOTE — ED Notes (Signed)
Patient spoke to mom from [place] on phone.

## 2015-09-01 NOTE — ED Notes (Signed)
Call placed to [facility] for pick up

## 2015-09-01 NOTE — Progress Notes (Addendum)
[facility] She is labile in mood. She is depressed, sad, and tearful in one moment, but than may become elated and pleasant. She stated that she needs help getting off of the substances that she takes and the alcohol she drinks. She stated that she had a headache and lower back pain. She stated that she did not feel depressed, anxious, or hopeless since she has been here, only when she was at home. She denied SI, HI, or AVH.   Patient remains safe with constant observation, education, support, and encouragement offered. Awaiting orders from provider.   Patient is receptive and cooperative. Will continue to monitor.

## 2015-09-01 NOTE — ED Notes (Signed)
Patient given sprite and crackers 

## 2015-09-01 NOTE — ED Provider Notes (Signed)
CSN: [id number]     Arrival date & time 08/31/15  2302 History  By signing my name below, I, Emmanuella Mensah, attest that this documentation has been prepared under the direction and in the presence of Geoffery Lyons, MD. Electronically Signed: Angelene Giovanni, ED Scribe. 09/01/2015. 12:31 AM.    Chief Complaint  Patient presents with  . Drug Overdose   Patient is a 40 y.o. female presenting with Overdose. The history is provided by the patient. No language interpreter was used.  Drug Overdose This is a new problem. The current episode started 1 to 2 hours ago. The problem has not changed since onset.Nothing aggravates the symptoms. Nothing relieves the symptoms. She has tried nothing for the symptoms.   HPI Comments: Kayla Reese is a 39 y.o. female with a hx of substance dependence on suboxone who presents to the Emergency Department for evaluation s/p possible drug overdose on opiates that occurred PTA. She states that she called her neighbor to bring her to the hospital. She reports associated numbness to her bilateral feet, lower back pain, and generalized fatigue. She adds that she was having suicidal ideations PTA. Pt explains that she was 7 years sober from drugs but started using crack and has been inhaling "dust" intermittently for the past month. She adds that she started using drugs again because she was going through a divorce. PTA, pt had approx. 6 pills of Percocet and 50 cans of duster. She denies a hx of heart issues or DM. No alleviating factors noted. No fever or n/v.    Past Medical History  Diagnosis Date  . Substance dependence (HCC)     on suboxone  . Hepatitis C    Past Surgical History  Procedure Laterality Date  . Abdominal surgery    . Cesarean section    . Arm surgery    . Novasure ablation    . Tubal ligation     No family history on file. Social History  Substance Use Topics  . Smoking status: Current Every Day Smoker -- 0.50 packs/day    Types:  Cigarettes  . Smokeless tobacco: None  . Alcohol Use: No   OB History    No data available     Review of Systems  All other systems reviewed and are negative.   A complete 10 system review of systems was obtained and all systems are negative except as noted in the HPI and PMH.    Allergies  Erythromycin  Home Medications   Prior to Admission medications   Medication Sig Start Date End Date Taking? Authorizing Provider  diphenhydrAMINE (BENADRYL) 25 mg capsule Take 25 mg by mouth daily as needed for allergies.   Yes Historical Provider, MD  Oxycodone-Acetaminophen (PERCOCET PO) Take 6 tablets by mouth once.    Yes Historical Provider, MD  acetaminophen (TYLENOL) 500 MG tablet Take 1 tablet (500 mg total) by mouth every 6 (six) hours as needed. Patient not taking: Reported on 08/31/2015 01/01/15   Cheri Fowler, PA-C   BP 111/76 mmHg  Pulse 69  Temp(Src) 97.8 F (36.6 C) (Oral)  Resp 18  Ht  (1.626 m)  Wt 80 lb (36.288 kg)  BMI 13.73 kg/m2  SpO2 96% Physical Exam  Constitutional: She is oriented to person, place, and time. She appears well-developed and well-nourished. No distress.  HENT:  Head: Normocephalic and atraumatic.  Eyes: EOM are normal. Pupils are equal, round, and reactive to light.  Neck: Normal range of motion.  Cardiovascular:  Normal rate, regular rhythm and normal heart sounds.   No murmur heard. Pulmonary/Chest: Effort normal and breath sounds normal. No respiratory distress. She has no wheezes. She has no rales.  Abdominal: Soft. She exhibits no distension. There is no tenderness.  Musculoskeletal: Normal range of motion.  Neurological: She is alert and oriented to person, place, and time.  Skin: Skin is warm and dry.  Psychiatric: Judgment normal. Cognition and memory are normal. She exhibits a depressed mood. She expresses suicidal ideation.  Nursing note and vitals reviewed.   ED Course  Procedures (including critical care time) DIAGNOSTIC  STUDIES: Oxygen Saturation is 96% on RA, normal by my interpretation.    COORDINATION OF CARE: 12:28 AM- Pt advised of plan for treatment and pt agrees. Pt will receive lab work for further evaluation.    Labs Review Labs Reviewed  CBG MONITORING, ED - Abnormal; Notable for the following:    Glucose-Capillary 106 (*)    All other components within normal limits  COMPREHENSIVE METABOLIC PANEL  ETHANOL  SALICYLATE LEVEL  ACETAMINOPHEN LEVEL  CBC  URINE RAPID DRUG SCREEN, HOSP PERFORMED  TROPONIN I  CK  POC URINE PREG, ED    Imaging Review No results found.   Geoffery Lyonsouglas Ellieana Dolecki, MD has personally reviewed and evaluated these images and lab results as part of his medical decision-making.   EKG Interpretation   Date/Time:  Friday August 31 2015 23:58:25 EDT Ventricular Rate:  73 PR Interval:  125 QRS Duration: 87 QT Interval:  495 QTC Calculation: 545 R Axis:   67 Text Interpretation:  Sinus rhythm RSR' in V1 or V2, probably normal  variant Borderline repolarization abnormality Prolonged QT interval  Confirmed by Ebbie Cherry  MD, Arcelia Pals ([id number]) on 09/01/2015 4:14:14 AM      MDM   Final diagnoses:  None    Patient presents for evaluation of overdose/substance abuse/suicidal ideation. This patient has an extensive history of drug addiction. Over the past several weeks she has been using more frequently, including crack cocaine and has snorted keyboard dusting canisters. She also reports to me that she is feeling suicidal and depressed. Her workup reveals no obvious abnormality, , however poison control is advising 6 hours of observation. She has been evaluated by TTS who is recommending inpatient psychiatric care. A bed search is underway and the patient should hopefully be transferred in the very near future. Care will be signed out to the oncoming provider at shift change.  I personally performed the services described in this documentation, which was scribed in my presence. The  recorded information has been reviewed and is accurate.       Geoffery Lyonsouglas Dwana Garin, MD 09/01/15 [id number]

## 2015-09-01 NOTE — ED Notes (Signed)
Pt transferred to TCU. Report given to Leslie, RN. 

## 2015-09-01 NOTE — ED Notes (Addendum)
Pt c/o of being cold, pt already several blankets covering her, thermostat in room turned up for patient comfort, VSS, will continue to monitor, pt requesting food, pt advised we are out of sandwiches at this time but she will be getting a breakfast tray this morning.

## 2015-09-01 NOTE — BH Assessment (Signed)
Pt. Has appointment  [facility] residential treatment on Monday 19th.  Plan to have neighbor come pick her up tomorrow upon d/c.  [place] Hijazi K. Sherlon HandingHarris, LCAS-A, LPC-A, [facility]  Counselor 09/01/2015 5:53 PM
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
## 2015-09-01 NOTE — ED Notes (Signed)
Personal belongings given to [employer]

## 2015-09-01 NOTE — ED Notes (Signed)
Bed: WA26 Expected date:  Expected time:  Means of arrival:  Comments: 

## 2015-09-01 NOTE — ED Notes (Signed)
Pt resting on stretcher with eyes closed, RR even and unlabored, NAD, VSS

## 2015-09-01 NOTE — ED Notes (Signed)
Per charge Angela Coxatty, [facility] by the writer (12:21 pm) to obtain an observation bed.  Inetta Fermoina advised the Clinical research associatewriter to [facility].

## 2015-09-01 NOTE — Consult Note (Signed)
[facility] Psychiatry Consult   Reason for Consult:  Polysubstance abuse Referring Physician:  EDP Patient Identification: Kayla Reese MRN:  [id number] Principal Diagnosis: Polysubstance (excluding opioids) dependence, daily use ([place]) Diagnosis:   Patient Active Problem List   Diagnosis Date Noted  . Polysubstance (excluding opioids) dependence, daily use ([place]) [F19.20] 09/01/2015    Priority: High  . Hepatitis, viral [B19.9] 01/12/2014  . 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 female patient does not warrant admission.  HPI:  40 yo female who presented to the ED stating she overdosed on opiates and "huffing" to get high, anxious and yelling.  Today, she is calm and cooperative.  Denies suicidal/homicidal ideations, hallucinations, and withdrawal symptoms.  She reports abusing crack, huffing, and alcohol.  Shemeca is interested in rehab and has been at [facility] in the past, referrals provided.   Past Psychiatric History: polysubstance abuse  Risk to Self: Suicidal Ideation: Yes-Currently Present Is patient at risk for suicide?: No Suicidal Plan?: Yes-Currently Present Specify Current Suicidal Plan: huff  Access to Means: Yes Specify Access to Suicidal Means: dust off What has been your use of drugs/alcohol within the last 12 months?: crack and huffing How many times?: 0 Other Self Harm Risks: denies Triggers for Past Attempts: None known Intentional Self Injurious Behavior: None Risk to Others: Homicidal Ideation: No Thoughts of Harm to Others: No Current Homicidal Intent: No Current Homicidal Plan: No Access to Homicidal Means: No Identified Victim: Denies History of harm to others?: No Assessment of Violence: None Noted Violent Behavior Description: Denies Does patient have access to weapons?: No Criminal Charges Pending?: Yes Describe Pending Criminal Charges:  posession of cocaine Does patient have a court date: Yes Court Date:  Myer Haff) Prior Inpatient Therapy: Prior Inpatient Therapy: Yes Prior Therapy Dates: 2016 Prior Therapy Facilty/Provider(s): [facility]  Reason for Treatment: SA Prior Outpatient Therapy: Prior Outpatient Therapy: Yes Prior Therapy Dates: 2016 Prior Therapy Facilty/Provider(s): [facility] Reason for Treatment: Addiction Does patient have an ACCT team?: No Does patient have Intensive In-House Services?  : No Does patient have [facility] services? : No Does patient have P4CC services?: No  Past Medical History:  Past Medical History  Diagnosis Date  . Substance dependence ([place])     on suboxone  . Hepatitis C     Past Surgical History  Procedure Laterality Date  . Abdominal surgery    . Cesarean section    . Arm surgery    . Novasure ablation    . Tubal ligation     Family History: No family history on file. Family Psychiatric  History: none Social History:  History  Alcohol Use No     History  Drug Use  . Yes    Comment: opiates, crystal meth, heroin    Social History   Social History  . Marital Status: Divorced    Spouse Name: N/A  . Number of Children: N/A  . Years of Education: N/A   Social History Main Topics  . Smoking status: Current Every Day Smoker -- 0.50 packs/day    Types: Cigarettes  . Smokeless tobacco: None  . Alcohol Use: No  . Drug Use: Yes     Comment: opiates, crystal meth, heroin  . Sexual Activity: Not Asked   Other Topics Concern  . None   Social History Narrative   Additional Social History:    Allergies:  Allergies  Allergen Reactions  . Erythromycin Rash    Labs:  Results for orders placed or performed during the hospital encounter of 08/31/15 (from the past 48 hour(s))  Rapid urine drug screen (hospital performed)     Status: None   Collection Time: 08/31/15 11:57 PM  Result Value Ref Range   Opiates NONE DETECTED NONE DETECTED   Cocaine NONE DETECTED NONE  DETECTED   Benzodiazepines NONE DETECTED NONE DETECTED   Amphetamines NONE DETECTED NONE DETECTED   Tetrahydrocannabinol NONE DETECTED NONE DETECTED   Barbiturates NONE DETECTED NONE DETECTED    Comment:        DRUG SCREEN FOR MEDICAL PURPOSES ONLY.  IF CONFIRMATION IS NEEDED FOR ANY PURPOSE, NOTIFY LAB WITHIN 5 DAYS.        LOWEST DETECTABLE LIMITS FOR URINE DRUG SCREEN Drug Class       Cutoff (ng/mL) Amphetamine      1000 Barbiturate      200 Benzodiazepine   646 Tricyclics       803 Opiates          300 Cocaine          300 THC              50   CBG monitoring, ED     Status: Abnormal   Collection Time: 09/01/15 12:02 AM  Result Value Ref Range   Glucose-Capillary 106 (H) 65 - 99 mg/dL  POC urine preg, ED     Status: None   Collection Time: 09/01/15 12:07 AM  Result Value Ref Range   Preg Test, Ur NEGATIVE NEGATIVE    Comment:        THE SENSITIVITY OF THIS METHODOLOGY IS >24 mIU/mL   Comprehensive metabolic panel     Status: Abnormal   Collection Time: 09/01/15 12:18 AM  Result Value Ref Range   Sodium 136 135 - 145 mmol/L   Potassium 2.8 (L) 3.5 - 5.1 mmol/L   Chloride 99 (L) 101 - 111 mmol/L   CO2 26 22 - 32 mmol/L   Glucose, Bld 104 (H) 65 - 99 mg/dL   BUN 7 6 - 20 mg/dL   Creatinine, Ser 0.73 0.44 - 1.00 mg/dL   Calcium 9.3 8.9 - 10.3 mg/dL   Total Protein 8.1 6.5 - 8.1 g/dL   Albumin 4.7 3.5 - 5.0 g/dL   AST 40 15 - 41 U/L   ALT 68 (H) 14 - 54 U/L   Alkaline Phosphatase 64 38 - 126 U/L   Total Bilirubin 1.1 0.3 - 1.2 mg/dL   GFR calc non Af Amer >60 >60 mL/min   GFR calc Af Amer >60 >60 mL/min    Comment: (NOTE) The eGFR has been calculated using the CKD EPI equation. This calculation has not been validated in all clinical situations. eGFR's persistently <60 mL/min signify possible Chronic Kidney Disease.    Anion gap 11 5 - 15  Ethanol     Status: None   Collection Time: 09/01/15 12:18 AM  Result Value Ref Range   Alcohol, Ethyl (B) <5 <5 mg/dL     Comment:        LOWEST DETECTABLE LIMIT FOR SERUM ALCOHOL IS 5 mg/dL FOR MEDICAL PURPOSES ONLY   Salicylate level     Status: None   Collection Time: 09/01/15 12:18 AM  Result Value Ref Range   Salicylate Lvl <2.1 2.8 - 30.0 mg/dL  Acetaminophen level     Status: Abnormal   Collection Time: 09/01/15  12:18 AM  Result Value Ref Range   Acetaminophen (Tylenol), Serum <10 (L) 10 - 30 ug/mL    Comment:        THERAPEUTIC CONCENTRATIONS VARY SIGNIFICANTLY. A RANGE OF 10-30 ug/mL MAY BE AN EFFECTIVE CONCENTRATION FOR MANY PATIENTS. HOWEVER, SOME ARE BEST TREATED AT CONCENTRATIONS OUTSIDE THIS RANGE. ACETAMINOPHEN CONCENTRATIONS >150 ug/mL AT 4 HOURS AFTER INGESTION AND >50 ug/mL AT 12 HOURS AFTER INGESTION ARE OFTEN ASSOCIATED WITH TOXIC REACTIONS.   cbc     Status: Abnormal   Collection Time: 09/01/15 12:18 AM  Result Value Ref Range   WBC 8.9 4.0 - 10.5 K/uL   RBC 5.13 (H) 3.87 - 5.11 MIL/uL   Hemoglobin 15.9 (H) 12.0 - 15.0 g/dL   HCT 45.2 36.0 - 46.0 %   MCV 88.1 78.0 - 100.0 fL   MCH 31.0 26.0 - 34.0 pg   MCHC 35.2 30.0 - 36.0 g/dL   RDW 12.7 11.5 - 15.5 %   Platelets 275 150 - 400 K/uL  Troponin I     Status: None   Collection Time: 09/01/15 12:18 AM  Result Value Ref Range   Troponin I <0.03 <0.031 ng/mL    Comment:        NO INDICATION OF MYOCARDIAL INJURY.   CK     Status: None   Collection Time: 09/01/15 12:18 AM  Result Value Ref Range   Total CK 45 38 - 234 U/L    No current facility-administered medications for this encounter.   Current Outpatient Prescriptions  Medication Sig Dispense Refill  . diphenhydrAMINE (BENADRYL) 25 mg capsule Take 25 mg by mouth daily as needed for allergies.    . Oxycodone-Acetaminophen (PERCOCET PO) Take 6 tablets by mouth once.     Marland Kitchen acetaminophen (TYLENOL) 500 MG tablet Take 1 tablet (500 mg total) by mouth every 6 (six) hours as needed. (Patient not taking: Reported on 08/31/2015) 14 tablet 0     Musculoskeletal: Strength & Muscle Tone: within normal limits Gait & Station: normal Patient leans: N/A  Psychiatric Specialty Exam: Physical Exam  Constitutional: She is oriented to person, place, and time. She appears well-developed and well-nourished.  HENT:  Head: Normocephalic.  Neck: Normal range of motion.  Respiratory: Effort normal.  Musculoskeletal: Normal range of motion.  Neurological: She is alert and oriented to person, place, and time.  Skin: Skin is warm and dry.  Psychiatric: Her speech is normal and behavior is normal. Judgment and thought content normal. Her mood appears anxious. Cognition and memory are normal.    Review of Systems  Constitutional: Negative.   HENT: Negative.   Eyes: Negative.   Respiratory: Negative.   Cardiovascular: Negative.   Gastrointestinal: Negative.   Genitourinary: Negative.   Musculoskeletal: Negative.   Skin: Negative.   Neurological: Negative.   Endo/Heme/Allergies: Negative.   Psychiatric/Behavioral: Positive for substance abuse. The patient is nervous/anxious.     Blood pressure 100/80, pulse 79, temperature 98.1 F (36.7 C), temperature source Oral, resp. rate 16, height 5' 4"  (1.626 m), weight 36.288 kg (80 lb), SpO2 95 %.Body mass index is 13.73 kg/(m^2).  General Appearance: Disheveled  Eye Contact:  Good  Speech:  Normal Rate  Volume:  Normal  Mood:  Anxious, mild  Affect:  Congruent  Thought Process:  Coherent  Orientation:  Full (Time, Place, and Person)  Thought Content:  WDL  Suicidal Thoughts:  No  Homicidal Thoughts:  No  Memory:  Immediate;   Good Recent;   Good Remote;  Good  Judgement:  Fair  Insight:  Fair  Psychomotor Activity:  Normal  Concentration:  Concentration: Good and Attention Span: Good  Recall:  Good  Fund of Knowledge:  Good  Language:  Good  Akathisia:  No  Handed:  Right  AIMS (if indicated):     Assets:  Housing Leisure Time Physical Health Resilience Social  Support Vocational/Educational  ADL's:  Intact  Cognition:  WNL  Sleep:        Treatment Plan Summary: Daily contact with patient to assess and evaluate symptoms and progress in treatment, Medication management and Plan polysubstance dependenced , non-opioids, daily use: -Crisis stabilization -Medication management:  Ibuprofen 800 mg once for headache -Individual and substance abuse counseling -Substance abuse resources and rehab information provided.  Disposition: No evidence of imminent risk to self or others at present.    Waylan Boga, NP 09/01/2015 10:52 AM Patient seen face-to-face for psychiatric evaluation, chart reviewed and case discussed with the physician extender and developed treatment plan. Reviewed the information documented and agree with the treatment plan. Corena Pilgrim, MD

## 2015-09-01 NOTE — ED Notes (Signed)
Patient belongings placed in locker #26. 

## 2015-09-01 NOTE — ED Notes (Signed)
PT released by poison control

## 2015-09-01 NOTE — ED Notes (Signed)
Pt brought to ED by neighbors, pt c/o numbness to hands and face, pt reports huffing duster and using crack x 1 month, heavy usage this week. Pt reports she has been using different substances since she was 15, clean from heroin x 2 years. Pt's ex husband has custody of 7 & 659 yo children, pt tearful speaking about them and reports he and his new wife are having another child any day. Pt endorses passive SI, wishing at times she would not wake up. Pt has been inpt detox several times, most success with AA. Pt states she has had no solid food x 3 days, drinking large amounts of water followed by vomiting. And occasional slushies. Alert with some child like behaviors and speech.

## 2015-09-01 NOTE — ED Notes (Signed)
EKG given to EDP,Delo,MD., for review. 

## 2015-09-01 NOTE — Progress Notes (Signed)
D: Patient seen awake watching TV. Talkative, hyperactive, demanding and intrusive. Patient reports chronic back pain of 7/10. Endorses anxiety. Denies SI, AH/VH. Compliant and cooperative with treatment regimen.  A: Support and encouragement offered. Redirected as needed. Offered due/prn meds as needed. Constant monitoring and observation for safety and stability. R: Patient receptive to nursing interventions.

## 2015-09-01 NOTE — [facility] Suicide Risk Assessment (Signed)
Suicide Risk Assessment  Discharge Assessment   [facility]   Principal Problem: Polysubstance (excluding opioids) dependence, daily use [facility](HCC) Discharge Diagnoses:  Patient Active Problem List   Diagnosis Date Noted  . Polysubstance (excluding opioids) dependence, daily use (HCC) [F19.20] 09/01/2015    Priority: High  . Hepatitis, viral [B19.9] 01/12/2014  . 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 She appears well-developed and well-nourished.  HENT:  Head: Normocephalic.  Neck: Normal range of motion.  Respiratory: Effort normal.  Musculoskeletal: Normal range of motion.  Neurological: She is alert and oriented to person, place, and time.  Skin: Skin is warm and dry.  Psychiatric: Her speech is normal and behavior is normal. Judgment and thought content normal. Her mood appears anxious. Cognition and memory are normal.    Review of Systems  Constitutional: Negative.   HENT: Negative.   Eyes: Negative.   Respiratory: Negative.   Cardiovascular: Negative.   Gastrointestinal: Negative.   Genitourinary: Negative.   Musculoskeletal: Negative.   Skin: Negative.   Neurological: Negative.   Endo/Heme/Allergies: Negative.   Psychiatric/Behavioral: Positive for substance abuse. The patient is nervous/anxious.     Blood pressure 100/80, pulse 79, temperature 98.1 F (36.7 C), temperature source Oral, resp. rate 16, height 5\' 4"  (1.626 m), weight 36.288 kg (80 lb), SpO2 95 %.Body mass index is 13.73 kg/(m^2).  General Appearance: Disheveled  Eye Contact:  Good  Speech:  Normal Rate  Volume:  Normal  Mood:  Anxious, mild  Affect:  Congruent  Thought Process:  Coherent  Orientation:  Full  (Time, Place, and Person)  Thought Content:  WDL  Suicidal Thoughts:  No  Homicidal Thoughts:  No  Memory:  Immediate;   Good Recent;   Good Remote;   Good  Judgement:  Fair  Insight:  Fair  Psychomotor Activity:  Normal  Concentration:  Concentration: Good and Attention Span: Good  Recall:  Good  Fund of Knowledge:  Good  Language:  Good  Akathisia:  No  Handed:  Right  AIMS (if indicated):     Assets:  Housing Leisure Time Physical Health Resilience Social Support Vocational/Educational  ADL's:  Intact  Cognition:  WNL  Sleep:       Mental Status Per Nursing Assessment::   On Admission:   drug abuse  Demographic Factors:  Caucasian and Living alone  Loss Factors: NA  Historical Factors: NA  Risk Reduction Factors:   Sense of responsibility to family, Employed and Positive social support  Continued Clinical Symptoms:  Anxiety, mild  Cognitive Features That Contribute To Risk:  None    Suicide Risk:  Minimal: No identifiable suicidal ideation.  Patients presenting with no risk factors but with morbid ruminations; may be classified as minimal risk based on the severity of the depressive symptoms    Plan Of Care/Follow-up recommendations:  Activity:  as tolerated Diet:  heart healthy diet  Celie Desrochers, NP 09/01/2015, 11:22 AM

## 2015-09-01 NOTE — ED Notes (Signed)
Bed: WA29 Expected date:  Expected time:  Means of arrival:  Comments: 

## 2015-09-01 NOTE — ED Notes (Signed)
Report given to Joy at [facility]. 

## 2015-09-01 NOTE — ED Notes (Signed)
Food tray given to patient 

## 2015-09-01 NOTE — BH Assessment (Signed)
Assessment completed. Consulted with ran McKinney AcresHobson, NP who recommends inpatient treatment at this time.   Davina PokeJoVea Harrison Zetina, LCSW Therapeutic Triage Specialist [facility] 09/01/2015 1:39 AM

## 2015-09-02 DIAGNOSIS — F14188 Cocaine abuse with other cocaine-induced disorder: Secondary | ICD-10-CM

## 2015-09-02 MED ORDER — HYDROXYZINE HCL 25 MG PO TABS: 25.0000 mg | ORAL_TABLET | Freq: Four times a day (QID) | ORAL | Status: AC | PRN

## 2015-09-02 MED ORDER — TRAZODONE HCL 50 MG PO TABS
50.0000 mg | ORAL_TABLET | Freq: Every evening | ORAL | Status: AC | PRN
Start: 2015-09-02 — End: ?

## 2015-09-02 MED ORDER — ACETAMINOPHEN 500 MG PO TABS: 500.0000 mg | ORAL_TABLET | Freq: Four times a day (QID) | ORAL | Status: AC | PRN

## 2015-09-02 NOTE — H&P (Signed)
[place] Unit H & P   Patient Identification: Kayla Reese MRN: [id number] Principal Diagnosis: Polysubstance (excluding opioids) dependence, daily use ([place]) Diagnosis:  Patient Active Problem List   Diagnosis Date Noted  . Polysubstance (excluding opioids) dependence, daily use ([place]) [F19.20] 09/01/2015    Priority: High  . Hepatitis, viral [B19.9] 01/12/2014  . 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 female patient who states "I have been using drugs for one month. It's been off an on over the last two years. I was sober for seven years before that. I want treatment for alcohol, cocaine, and for huffing dust cans."   HQP:RFFMBW Kayla Reese is a 40 yo female who presented to the [facility] stating she overdosed on opiates and "huffing" to get high, anxious and yelling.During assessment she is calm and cooperative. Denies suicidal/homicidal ideations, hallucinations, and withdrawal symptoms. She reports abusing crack, huffing, and alcohol. Kayla Reese is interested in rehab and has been at [facility] in the past and reports that she will be going for drug treatment tomorrow morning. Patient reports that she has used about $80 worth of cocaine almost daily for the past two years after being sober for 7.5 years. Patient reports that she last used about five days ago. Patient reports that she "huffs" inhalants such as "dust off" and has been using 15-20 cans daily over the past three weeks. Patients UDS is clear and BAL <5 at time of assessment.   Past Psychiatric History: polysubstance abuse  Risk to Self: Suicidal Ideation: Denies Is patient at risk for suicide?: No Suicidal Plan?: Denies Specify Current Suicidal Plan: No Access to Means: Yes Specify Access to Suicidal Means: dust off What has been your use of drugs/alcohol within the last 12 months?: crack and huffing How  many times?: 0 Other Self Harm Risks: denies Triggers for Past Attempts: None known Intentional Self Injurious Behavior: None Risk to Others: Homicidal Ideation: No Thoughts of Harm to Others: No Current Homicidal Intent: No Current Homicidal Plan: No Access to Homicidal Means: No Identified Victim: Denies History of harm to others?: No Assessment of Violence: None Noted Violent Behavior Description: Denies Does patient have access to weapons?: No Criminal Charges Pending?: Yes Describe Pending Criminal Charges: posession of cocaine Does patient have a court date: Yes Court Date: [place]aff) Prior Inpatient Therapy: Prior Inpatient Therapy: Yes Prior Therapy Dates: 2016 Prior Therapy Facilty/Provider(s): [facility]  Reason for Treatment: SA Prior Outpatient Therapy: Prior Outpatient Therapy: Yes Prior Therapy Dates: 2016 Prior Therapy Facilty/Provider(s): [facility] Reason for Treatment: Addiction Does patient have an ACCT team?: No Does patient have Intensive In-House Services? : No Does patient have [facility] services? : No Does patient have P4CC services?: No  Past Medical History:  Past Medical History  Diagnosis Date  . Substance dependence ([place])     on suboxone  . Hepatitis C     Past Surgical History  Procedure Laterality Date  . Abdominal surgery    . Cesarean section    . Arm surgery    . Novasure ablation    . Tubal ligation     Family History: No family history on file. Family Psychiatric History: none Social History:  History  Alcohol Use No    History  Drug Use  . Yes    Comment: opiates, crystal meth, heroin    Social History   Social History  . Marital Status: Divorced  Spouse Name: N/A  . Number of Children: N/A  . Years of Education: N/A   Social History Main Topics  . Smoking status: Current Every Day Smoker -- 0.50 packs/day    Types: Cigarettes  . Smokeless  tobacco: None  . Alcohol Use: No  . Drug Use: Yes     Comment: opiates, crystal meth, heroin  . Sexual Activity: Not Asked   Other Topics Concern  . None   Social History Narrative   Additional Social History:    Allergies:  Allergies  Allergen Reactions  . Erythromycin Rash    Labs:   Lab Results Last 48 Hours    Results for orders placed or performed during the hospital encounter of 08/31/15 (from the past 48 hour(s))  Rapid urine drug screen (hospital performed) Status: None   Collection Time: 08/31/15 11:57 PM  Result Value Ref Range   Opiates NONE DETECTED NONE DETECTED   Cocaine NONE DETECTED NONE DETECTED   Benzodiazepines NONE DETECTED NONE DETECTED   Amphetamines NONE DETECTED NONE DETECTED   Tetrahydrocannabinol NONE DETECTED NONE DETECTED   Barbiturates NONE DETECTED NONE DETECTED    Comment:   DRUG SCREEN FOR MEDICAL PURPOSES ONLY. IF CONFIRMATION IS NEEDED FOR ANY PURPOSE, NOTIFY LAB WITHIN 5 DAYS.   LOWEST DETECTABLE LIMITS FOR URINE DRUG SCREEN Drug Class Cutoff (ng/mL) Amphetamine 1000 Barbiturate 200 Benzodiazepine 253 Tricyclics 664 Opiates 403 Cocaine 300 THC 50   CBG monitoring, ED Status: Abnormal   Collection Time: 09/01/15 12:02 AM  Result Value Ref Range   Glucose-Capillary 106 (H) 65 - 99 mg/dL  POC urine preg, ED Status: None   Collection Time: 09/01/15 12:07 AM  Result Value Ref Range   Preg Test, Ur NEGATIVE NEGATIVE    Comment:   THE SENSITIVITY OF THIS METHODOLOGY IS >24 mIU/mL   Comprehensive metabolic panel Status: Abnormal   Collection Time: 09/01/15 12:18 AM  Result Value Ref Range   Sodium 136 135 - 145 mmol/L   Potassium 2.8 (L) 3.5 - 5.1 mmol/L   Chloride 99 (L) 101 - 111 mmol/L   CO2 26 22 - 32 mmol/L    Glucose, Bld 104 (H) 65 - 99 mg/dL   BUN 7 6 - 20 mg/dL   Creatinine, Ser 0.73 0.44 - 1.00 mg/dL   Calcium 9.3 8.9 - 10.3 mg/dL   Total Protein 8.1 6.5 - 8.1 g/dL   Albumin 4.7 3.5 - 5.0 g/dL   AST 40 15 - 41 U/L   ALT 68 (H) 14 - 54 U/L   Alkaline Phosphatase 64 38 - 126 U/L   Total Bilirubin 1.1 0.3 - 1.2 mg/dL   GFR calc non Af Amer >60 >60 mL/min   GFR calc Af Amer >60 >60 mL/min    Comment: (NOTE) The eGFR has been calculated using the CKD EPI equation. This calculation has not been validated in all clinical situations. eGFR's persistently <60 mL/min signify possible Chronic Kidney Disease.    Anion gap 11 5 - 15  Ethanol Status: None   Collection Time: 09/01/15 12:18 AM  Result Value Ref Range   Alcohol, Ethyl (B) <5 <5 mg/dL    Comment:   LOWEST DETECTABLE LIMIT FOR SERUM ALCOHOL IS 5 mg/dL FOR MEDICAL PURPOSES ONLY   Salicylate level Status: None   Collection Time: 09/01/15 12:18 AM  Result Value Ref Range   Salicylate Lvl <4.7 2.8 - 30.0 mg/dL  Acetaminophen level Status: Abnormal   Collection Time: 09/01/15 12:18 AM  Result Value  Ref Range   Acetaminophen (Tylenol), Serum <10 (L) 10 - 30 ug/mL    Comment:   THERAPEUTIC CONCENTRATIONS VARY SIGNIFICANTLY. A RANGE OF 10-30 ug/mL MAY BE AN EFFECTIVE CONCENTRATION FOR MANY PATIENTS. HOWEVER, SOME ARE BEST TREATED AT CONCENTRATIONS OUTSIDE THIS RANGE. ACETAMINOPHEN CONCENTRATIONS >150 ug/mL AT 4 HOURS AFTER INGESTION AND >50 ug/mL AT 12 HOURS AFTER INGESTION ARE OFTEN ASSOCIATED WITH TOXIC REACTIONS.   cbc Status: Abnormal   Collection Time: 09/01/15 12:18 AM  Result Value Ref Range   WBC 8.9 4.0 - 10.5 K/uL   RBC 5.13 (H) 3.87 - 5.11 MIL/uL   Hemoglobin 15.9 (H) 12.0 - 15.0 g/dL   HCT 45.2 36.0 - 46.0 %   MCV 88.1 78.0 - 100.0 fL   MCH 31.0 26.0 - 34.0 pg    MCHC 35.2 30.0 - 36.0 g/dL   RDW 12.7 11.5 - 15.5 %   Platelets 275 150 - 400 K/uL  Troponin I Status: None   Collection Time: 09/01/15 12:18 AM  Result Value Ref Range   Troponin I <0.03 <0.031 ng/mL    Comment:   NO INDICATION OF MYOCARDIAL INJURY.   CK Status: None   Collection Time: 09/01/15 12:18 AM  Result Value Ref Range   Total CK 45 38 - 234 U/L      No current facility-administered medications for this encounter.   Current Outpatient Prescriptions  Medication Sig Dispense Refill  . diphenhydrAMINE (BENADRYL) 25 mg capsule Take 25 mg by mouth daily as needed for allergies.    . Oxycodone-Acetaminophen (PERCOCET PO) Take 6 tablets by mouth once.     Marland Kitchen acetaminophen (TYLENOL) 500 MG tablet Take 1 tablet (500 mg total) by mouth every 6 (six) hours as needed. (Patient not taking: Reported on 08/31/2015) 14 tablet 0    Musculoskeletal: Strength & Muscle Tone: within normal limits Gait & Station: normal Patient leans: N/A  Psychiatric Specialty Exam: Physical Exam  Constitutional: She is oriented to person, place, and time. She appears well-developed and well-nourished.  HENT:  Head: Normocephalic.  Neck: Normal range of motion.  Respiratory: Effort normal.  Musculoskeletal: Normal range of motion.  Neurological: She is alert and oriented to person, place, and time.  Skin: Skin is warm and dry.  Psychiatric: Her speech is normal and behavior is normal. Judgment and thought content normal. Her mood appears anxious. Cognition and memory are normal.    Review of Systems  Constitutional: Negative.  HENT: Negative.  Eyes: Negative.  Respiratory: Negative.  Cardiovascular: Negative.  Gastrointestinal: Negative.  Genitourinary: Negative.  Musculoskeletal: Negative.  Skin: Negative.  Neurological: Negative.  Endo/Heme/Allergies: Negative.  Psychiatric/Behavioral: Positive for  substance abuse. The patient is nervous/anxious.    Blood pressure 100/80, pulse 79, temperature 98.1 F (36.7 C), temperature source Oral, resp. rate 16, height _0  (1.626 m), weight 36.288 kg (80 lb), SpO2 95 %.Body mass index is 13.73 kg/(m^2).  General Appearance: Casual   Eye Contact: Good  Speech: Normal Rate  Volume: Normal  Mood: Anxious, mild  Affect: Congruent  Thought Process: Coherent  Orientation: Full (Time, Place, and Person)  Thought Content: WDL  Suicidal Thoughts: No  Homicidal Thoughts: No  Memory: Immediate; Good Recent; Good Remote; Good  Judgement: Fair  Insight: Fair  Psychomotor Activity: Normal  Concentration: Concentration: Good and Attention Span: Good  Recall: Good  Fund of Knowledge: Good  Language: Good  Akathisia: No  Handed: Right  AIMS (if indicated):    Assets: Housing Leisure Time Physical Health Resilience  Social Support Vocational/Educational  ADL's: Intact  Cognition: WNL  Sleep:       Treatment Plan Summary: Daily contact with patient to assess and evaluate symptoms and progress in treatment, Medication management and Plan polysubstance dependence , non-opioids, daily use: -Crisis stabilization -Medication management: -Individual and substance abuse counseling -Substance abuse resources and rehab information provided. -Trazodone 50 mg hs prn insomnia -Vistaril 25 mg every six hours prn anxiety   Disposition: No evidence of imminent risk to self or others at present.  Patient feels stable to discharge home today in order to prepare for treatment at [facility] tomorrow. She request samples and prescription for Trazodone/Vistaril upon discharge from [facility]   Kayla Shiley, NP 09/02/2015 10:10 AM     I agree with findings and plan.

## 2015-09-02 NOTE — Discharge Summary (Signed)
  Kayla Reese is a 40 yo female [facility] stating she overdosed on opiates and "huffing" to get high.During assessment she is calm and cooperative. Denies suicidal/homicidal ideations, hallucinations, and withdrawal symptoms. She reports abusing crack, huffing agents, and alcohol. Kayla Reese is interested in rehab and [facility] and reports that she will be going for drug treatment tomorrow morning. Patient reports that she has used about $80 worth of cocaine almost daily for the past two years after being sober for 7.5 years. Patient reports that she last used about five days ago. Patient reports that she "huffs" inhalants such as "dust off" and has been using 15-20 cans daily over the past three weeks. Patients UDS is clear and BAL <5 at time of assessment.   [facility]. Kayla Reese reported feeling much better the next morning and denied any suicidal thoughts. Her mood was noted to be bright and patient was easily engaged in assessment. Patient appeared motivated to [facility]. She reported her desire to return home to pack and that her neighbor would take her to [facility] tomorrow. She requested prescription for trazodone and vistaril as needed and was provided with a one week supply of medications. Patient left [facility] in stable condition with all belongings returned to her. [facility] denied suicidal/homicidal ideation or AVH.   Patient seen by Fransisca KaufmannLaura Davis  I have reviewed notes and agree with treatment plan.

## 2015-09-02 NOTE — Progress Notes (Signed)
Patient received belongings, AVS instructions, medication scripts, and medication samples. Patient d/c'd at 1242.

## 2015-09-02 NOTE — Progress Notes (Signed)
Patient reports feeling better this am. [facility] She reports no depression, anxiety, or hopelessness at this time. She denies SI, Hi, and AVH.   Patient is monitored through constant observation, and medications administered.  Support, encouragement, and education given.   Patient is safe, receptive and compliant, will continue to monitor.

## 2015-09-02 NOTE — Discharge Instructions (Signed)
You are encouraged to follow up with [facility] for residential substance abuse treatment. You have expressed that you have been accepted for Monday June 19th for residential treatment. You are also encouraged [facility].

## 2015-09-20 ENCOUNTER — Encounter (HOSPITAL_BASED_OUTPATIENT_CLINIC_OR_DEPARTMENT_OTHER): Payer: Self-pay | Admitting: Emergency Medicine

## 2015-09-20 ENCOUNTER — Emergency Department (HOSPITAL_BASED_OUTPATIENT_CLINIC_OR_DEPARTMENT_OTHER)
Admission: EM | Admit: 2015-09-20 | Discharge: 2015-09-20 | Disposition: A | Discharge status: Home / Self Care | Payer: Self-pay | Attending: Emergency Medicine | Admitting: Emergency Medicine

## 2015-09-20 ENCOUNTER — Emergency Department (HOSPITAL_BASED_OUTPATIENT_CLINIC_OR_DEPARTMENT_OTHER): Payer: Self-pay

## 2015-09-20 DIAGNOSIS — W2106XA Struck by volleyball, initial encounter: Secondary | ICD-10-CM | POA: Insufficient documentation

## 2015-09-20 DIAGNOSIS — S62627A Displaced fracture of medial phalanx of left little finger, initial encounter for closed fracture: Secondary | ICD-10-CM | POA: Insufficient documentation

## 2015-09-20 DIAGNOSIS — Y999 Unspecified external cause status: Secondary | ICD-10-CM | POA: Insufficient documentation

## 2015-09-20 DIAGNOSIS — S62609A Fracture of unspecified phalanx of unspecified finger, initial encounter for closed fracture: Secondary | ICD-10-CM

## 2015-09-20 DIAGNOSIS — Y9368 Activity, volleyball (beach) (court): Secondary | ICD-10-CM | POA: Insufficient documentation

## 2015-09-20 DIAGNOSIS — Y929 Unspecified place or not applicable: Secondary | ICD-10-CM | POA: Insufficient documentation

## 2015-09-20 DIAGNOSIS — Y939 Activity, unspecified: Secondary | ICD-10-CM | POA: Insufficient documentation

## 2015-09-20 DIAGNOSIS — F17210 Nicotine dependence, cigarettes, uncomplicated: Secondary | ICD-10-CM | POA: Insufficient documentation

## 2015-09-20 NOTE — ED Notes (Signed)
Pt in with bruising and decreased ROM to L 5th finger and mild bruising to R 4th finger from playing volleyball last night.

## 2015-09-20 NOTE — ED Provider Notes (Signed)
CSN: [id number]     Arrival date & time 09/20/15  1045 History   First MD Initiated Contact with Patient 09/20/15 1114     Chief Complaint  Patient presents with  . Finger Injury     (Consider location/radiation/quality/duration/timing/severity/associated sxs/prior Treatment) HPI   Kayla Reese is a 40 year old female with past medical history of substance abuse, hepatitis C who presents to the ED today complaining of pain in her left pinky finger. Patient states that last night she was playing volleyball and when she went to set the ball she jammed her left little finger. She states the finger has become very swollen and there is bruising on the bottom of it. She is concerned she may have broken her finger. She states the pain is minimal but is worsened when she tries to bend it. Sensation is intact.   Past Medical History  Diagnosis Date  . Substance dependence (HCC)     on suboxone  . Hepatitis C    Past Surgical History  Procedure Laterality Date  . Abdominal surgery    . Cesarean section    . Arm surgery    . Novasure ablation    . Tubal ligation     History reviewed. No pertinent family history. Social History  Substance Use Topics  . Smoking status: Current Every Day Smoker -- 0.50 packs/day    Types: Cigarettes  . Smokeless tobacco: None  . Alcohol Use: No   OB History    No data available     Review of Systems  All other systems reviewed and are negative.     Allergies  Erythromycin  Home Medications   Prior to Admission medications   Medication Sig Start Date End Date Taking? Authorizing Provider  acetaminophen (TYLENOL) 500 MG tablet Take 1 tablet (500 mg total) by mouth every 6 (six) hours as needed. 09/02/15   Thermon LeylandLaura A Davis, NP  hydrOXYzine (ATARAX/VISTARIL) 25 MG tablet Take 1 tablet (25 mg total) by mouth every 6 (six) hours as needed for anxiety. 09/02/15   Thermon LeylandLaura A Davis, NP  traZODone (DESYREL) 50 MG tablet Take 1 tablet (50 mg total) by mouth  at bedtime as needed for sleep. 09/02/15   Thermon LeylandLaura A Davis, NP   BP 119/75 mmHg  Pulse 64  Temp(Src) 98.7 F (37.1 C) (Oral)  Resp 16  Ht 5\' 4"  (1.626 m)  Wt 53.071 kg  BMI 20.07 kg/m2  SpO2 99%  LMP 09/16/2015 Physical Exam  Constitutional: She is oriented to person, place, and time. She appears well-developed and well-nourished. No distress.  HENT:  Head: Normocephalic and atraumatic.  Eyes: Conjunctivae are normal. Right eye exhibits no discharge. Left eye exhibits no discharge. No scleral icterus.  Cardiovascular: Normal rate.   Pulmonary/Chest: Effort normal.  Musculoskeletal:  Ecchymosis and tenderness over the flexor surface of the left fifth digit over the PIP joint. Increased pain with flexion of this digit. No obvious bony deformity. Good capillary refill.  Neurological: She is alert and oriented to person, place, and time. Coordination normal.  Skin: Skin is warm and dry. No rash noted. She is not diaphoretic. No erythema. No pallor.  Psychiatric: She has a normal mood and affect. Her behavior is normal.  Nursing note and vitals reviewed.   ED Course  Procedures (including critical care time) Labs Review Labs Reviewed - No data to display  Imaging Review Dg Finger Little Left  09/20/2015  CLINICAL DATA:  Slammed left fifth finger playing volleyball last night  with bruising and swelling. Initial encounter. EXAM: LEFT LITTLE FINGER 2+V COMPARISON:  None. FINDINGS: Volar plate avulsion fracture without measurable articular surface involvement. The fracture is distracted along its distal margin, causing mild rotation. No malalignment. IMPRESSION: Volar plate avulsion fracture as described. Electronically Signed   By: Marnee SpringJonathon  Watts M.D.   On: 09/20/2015 11:28   I have personally reviewed and evaluated these images and lab results as part of my medical decision-making.   EKG Interpretation None      MDM   Final diagnoses:  Finger fracture, closed, initial encounter    X-ray reveals a volar plate avulsion fracture with mild rotation. Patient placed in finger splint and recommend follow-up with orthopedics. Neurovascularly intact. Patient is now a pain medication as she is currently in recovery from drug abuse. Recommend NSAIDs. Return precautions outlined in patient discharge instructions.    Lester KinsmanSamantha Tripp [place], PA-C 09/20/15 1548  Lavera Guiseana Duo Liu, MD 09/20/15 [phone]

## 2015-09-20 NOTE — Discharge Instructions (Signed)
Finger Fracture Fractures of fingers are breaks in the bones of the fingers. There are many types of fractures. There are different ways of treating these fractures. Your health care provider will discuss the best way to treat your fracture. CAUSES Traumatic injury is the main cause of broken fingers. These include:  Injuries while playing sports.  Workplace injuries.  Falls. RISK FACTORS Activities that can increase your risk of finger fractures include:  Sports.  Workplace activities that involve machinery.  A condition called osteoporosis, which can make your bones less dense and cause them to fracture more easily. SIGNS AND SYMPTOMS The main symptoms of a broken finger are pain and swelling within 15 minutes after the injury. Other symptoms include:  Bruising of your finger.  Stiffness of your finger.  Numbness of your finger.  Exposed bones (compound fracture) if the fracture is severe. DIAGNOSIS  The best way to diagnose a broken bone is with X-ray imaging. Additionally, your health care provider will use this X-ray image to evaluate the position of the broken finger bones.  TREATMENT  Finger fractures can be treated with:   Nonreduction--This means the bones are in place. The finger is splinted without changing the positions of the bone pieces. The splint is usually left on for about a week to 10 days. This will depend on your fracture and what your health care provider thinks.  Closed reduction--The bones are put back into position without using surgery. The finger is then splinted.  Open reduction and internal fixation--The fracture site is opened. Then the bone pieces are fixed into place with pins or some type of hardware. This is seldom required. It depends on the severity of the fracture. HOME CARE INSTRUCTIONS   Follow your health care provider's instructions regarding activities, exercises, and physical therapy.  Only take over-the-counter or prescription  medicines for pain, discomfort, or fever as directed by your health care provider. SEEK MEDICAL CARE IF: You have pain or swelling that limits the motion or use of your fingers. SEEK IMMEDIATE MEDICAL CARE IF:  Your finger becomes numb. MAKE SURE YOU:   Understand these instructions.  Will watch your condition.  Will get help right away if you are not doing well or get worse.   This information is not intended to replace advice given to you by your health care provider. Make sure you discuss any questions you have with your health care provider.   Apply ice to affected area. Keep finger in splint as much as possible and try to avoid bending it until it has healed. Follow-up with your primary care doctor or with Dr. Pearletha ForgeHudnall with sports medicine for reevaluation. Take Profen as needed for pain. Return to the ED if you experience severe worsening of your pain, increased redness or swelling around her finger, fevers, chills, further discoloration of your finger.

## 2015-09-20 NOTE — ED Notes (Signed)
Patient transported to X-ray 

## 2016-01-31 ENCOUNTER — Other Ambulatory Visit: Payer: Self-pay | Admitting: Obstetrics and Gynecology

## 2016-01-31 DIAGNOSIS — Z1231 Encounter for screening mammogram for malignant neoplasm of breast: Secondary | ICD-10-CM

## 2016-02-28 ENCOUNTER — Ambulatory Visit (HOSPITAL_COMMUNITY): Payer: Self-pay

## 2016-05-09 ENCOUNTER — Emergency Department (HOSPITAL_COMMUNITY)
Admission: EM | Admit: 2016-05-09 | Discharge: 2016-05-09 | Disposition: A | Discharge status: Home / Self Care | Payer: Self-pay | Attending: Emergency Medicine | Admitting: Emergency Medicine

## 2016-05-09 ENCOUNTER — Encounter (HOSPITAL_COMMUNITY): Payer: Self-pay

## 2016-05-09 DIAGNOSIS — M79605 Pain in left leg: Secondary | ICD-10-CM

## 2016-05-09 DIAGNOSIS — Z79899 Other long term (current) drug therapy: Secondary | ICD-10-CM | POA: Insufficient documentation

## 2016-05-09 DIAGNOSIS — M79662 Pain in left lower leg: Secondary | ICD-10-CM | POA: Insufficient documentation

## 2016-05-09 DIAGNOSIS — F17210 Nicotine dependence, cigarettes, uncomplicated: Secondary | ICD-10-CM | POA: Insufficient documentation

## 2016-05-09 DIAGNOSIS — M79604 Pain in right leg: Secondary | ICD-10-CM

## 2016-05-09 DIAGNOSIS — M79661 Pain in right lower leg: Secondary | ICD-10-CM | POA: Insufficient documentation

## 2016-05-09 LAB — I-STAT CHEM 8, ED
BUN: 11 mg/dL (ref 6–20)
CHLORIDE: 99 mmol/L — AB (ref 101–111)
Calcium, Ion: 0.99 mmol/L — ABNORMAL LOW (ref 1.15–1.40)
Creatinine, Ser: 0.6 mg/dL (ref 0.44–1.00)
Glucose, Bld: 109 mg/dL — ABNORMAL HIGH (ref 65–99)
HCT: 42 % (ref 36.0–46.0)
Hemoglobin: 14.3 g/dL (ref 12.0–15.0)
POTASSIUM: 2.8 mmol/L — AB (ref 3.5–5.1)
Sodium: 134 mmol/L — ABNORMAL LOW (ref 135–145)
TCO2: 24 mmol/L (ref 0–100)

## 2016-05-09 MED ORDER — KETOROLAC TROMETHAMINE 60 MG/2ML IM SOLN
60.0000 mg | Freq: Once | INTRAMUSCULAR | Status: AC
  Administered 2016-05-09: 60 mg via INTRAMUSCULAR
  Filled 2016-05-09: qty 2

## 2016-05-09 MED ORDER — POTASSIUM CHLORIDE CRYS ER 20 MEQ PO TBCR
40.0000 meq | EXTENDED_RELEASE_TABLET | Freq: Once | ORAL | Status: AC
  Administered 2016-05-09: 40 meq via ORAL
  Filled 2016-05-09: qty 2

## 2016-05-09 MED ORDER — IBUPROFEN 800 MG PO TABS: 800.0000 mg | ORAL_TABLET | Freq: Three times a day (TID) | ORAL | 0 refills | Status: AC

## 2016-05-09 NOTE — ED Triage Notes (Signed)
Since 2100 pm yesterday fever and body aches pt crying with pain.

## 2016-05-09 NOTE — Discharge Instructions (Signed)
Your labs today showed you have a low potassium.  This may be contributing to your symptoms as it can cause some muscle cramping.  We gave you supplements here but recommend to eat potassium rich foods (bananas, orange juice, etc.) for the next few days to keep your levels up. Follow-up with your primary care doctor. Return here for new concerns.

## 2016-05-09 NOTE — ED Provider Notes (Signed)
[facility]-EMERGENCY DEPT Provider Note   CSN: [id number] Arrival date & time: 05/09/16  0156     History   Chief Complaint Chief Complaint  Patient presents with  . Fever  . Generalized Body Aches    HPI Kayla Reese is a 41 y.o. female.  The history is provided by the patient and medical records.  Fever      41 year old female with history of hepatitis C, substance use currently on Suboxone, presenting to the ED for body aches. Patient reports this began around 9 PM last night and has been steadily worsening. She reports dull, aching pain in her low back and both of her legs. She denies any numbness or weakness of her legs. No bowel or bladder incontinence no injury or trauma.  She does report cough but she smokes so this is chronic for her.  Also reports fever. States she feels tired. States her legs have been aching which is causing her to have a hard time getting comfortable. States she has been self-medicating with alcohol at home. She's not tried any over-the-counter medication such as Tylenol or Motrin. Boyfriend became concerned and so called EMS. She's not had any sick contacts with flu that she knows of. She did not receive a flu vaccination this year.  Past Medical History:  Diagnosis Date  . Hepatitis C   . Substance dependence (HCC)    on suboxone    Patient Active Problem List   Diagnosis Date Noted  . Polysubstance (excluding opioids) dependence, daily use (HCC) 09/01/2015  . 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 Hepatitis, viral 01/12/2014  . Substance abuse in remission 01/12/2014    Past Surgical History:  Procedure Laterality Date  . ABDOMINAL SURGERY    . arm surgery    . CESAREAN SECTION    . NOVASURE ABLATION    . TUBAL LIGATION      OB History    No data available       Home Medications    Prior to Admission medications   Medication Sig Start Date End Date Taking? Authorizing Provider  acetaminophen  (TYLENOL) 500 MG tablet Take 1 tablet (500 mg total) by mouth every 6 (six) hours as needed. 09/02/15   Thermon LeylandLaura A Davis, NP  hydrOXYzine (ATARAX/VISTARIL) 25 MG tablet Take 1 tablet (25 mg total) by mouth every 6 (six) hours as needed for anxiety. 09/02/15   Thermon LeylandLaura A Davis, NP  traZODone (DESYREL) 50 MG tablet Take 1 tablet (50 mg total) by mouth at bedtime as needed for sleep. 09/02/15   Thermon LeylandLaura A Davis, NP    Family History History reviewed. No pertinent family history.  Social History Social History  Substance Use Topics  . Smoking status: Current Every Day Smoker    Packs/day: 0.50    Types: Cigarettes  . Smokeless tobacco: Never Used  . Alcohol use No     Allergies   Erythromycin   Review of Systems Review of Systems  Constitutional: Positive for fever.  All other systems reviewed and are negative.    Physical Exam Updated Vital Signs BP 117/65 (BP Location: Left Arm)   Pulse 103   Temp 99.3 F (37.4 C) (Oral)   Resp 20   Ht 5\' 4"  (1.626 m)   Wt 53.1 kg   SpO2 100%   BMI 20.08 kg/m   Physical Exam  Constitutional: She is oriented to person, place, and time. She appears well-developed and well-nourished.  constantantly moving around in  the bed during exam, eventually sitting on all 4's in the bed with her backside up into the air, moaning and yelling  HENT:  Head: Normocephalic and atraumatic.  Mouth/Throat: Oropharynx is clear and moist.  Eyes: Conjunctivae and EOM are normal. Pupils are equal, round, and reactive to light.  Neck: Normal range of motion.  Cardiovascular: Normal rate, regular rhythm and normal heart sounds.   Pulmonary/Chest: Effort normal and breath sounds normal.  Abdominal: Soft. Bowel sounds are normal.  Musculoskeletal: Normal range of motion.  Legs normal in appearance without visible swelling, overlying skin changes, warmth to touch, open wounds, or signs of infection; legs are non-tender to palpation; there are no palpable cords; no tissue  crepitus, compartments are soft, DP pulses intact  Neurological: She is alert and oriented to person, place, and time.  Skin: Skin is warm and dry.  Psychiatric: She has a normal mood and affect.  Nursing note and vitals reviewed.    ED Treatments / Results  Labs (all labs ordered are listed, but only abnormal results are displayed) Labs Reviewed  I-STAT CHEM 8, ED - Abnormal; Notable for the following:       Result Value   Sodium 134 (*)    Potassium 2.8 (*)    Chloride 99 (*)    Glucose, Bld 109 (*)    Calcium, Ion 0.99 (*)    All other components within normal limits    EKG  EKG Interpretation None       Radiology No results found.  Procedures Procedures (including critical care time)  Medications Ordered in ED Medications  ketorolac (TORADOL) injection 60 mg (60 mg Intramuscular Given 05/09/16 0320)  potassium chloride SA (K-DUR,KLOR-CON) CR tablet 40 mEq (40 mEq Oral Given 05/09/16 0457)     Initial Impression / Assessment and Plan / ED Course  I have reviewed the triage vital signs and the nursing notes.  Pertinent labs & imaging results that were available during my care of the patient were reviewed by me and considered in my medical decision making (see chart for details).  41 y.o. F here with aching in her legs.  States this began at midnight.  She denies injury, trauma, or falls.  On exam, patient is constantly moving around in bed, screaming and moaning.  She has no deformities, swelling, or signs of trauma to the legs.  No calf tenderness or palpable cords suggestive of DVT.   No evidence of cellulitis.  Reports fever but afebrile and non-toxic in appearance here.  Does report cough, but states it is chronic as she smokes.  Chem-8 obtained, K+ low at 2.8, replaced here.  Patient was treated here with Toradol, resting comfortably. Pain may be secondary to her hypokalemia. Lower suspicion for flu given lack of fever or other related symptoms here.  Encouraged  potassium-rich diet over the next few days. Follow-up with PCP.  Discussed plan with patient, she acknowledged understanding and agreed with plan of care.  Return precautions given for new or worsening symptoms.   Final Clinical Impressions(s) / ED Diagnoses   Final diagnoses:  Pain in both lower extremities    New Prescriptions Discharge Medication List as of 05/09/2016  5:08 AM    START taking these medications   Details  ibuprofen (ADVIL,MOTRIN) 800 MG tablet Take 1 tablet (800 mg total) by mouth 3 (three) times daily., Starting Fri 05/09/2016, Print         Garlon Hatchet, PA-C 05/09/16 1610    April  Nicanor Alcon, MD 05/09/16 1610

## 2016-08-02 ENCOUNTER — Encounter (HOSPITAL_COMMUNITY): Payer: Self-pay | Admitting: Emergency Medicine

## 2016-08-02 ENCOUNTER — Emergency Department (HOSPITAL_COMMUNITY)
Admission: EM | Admit: 2016-08-02 | Discharge: 2016-08-02 | Disposition: A | Discharge status: Home / Self Care | Payer: Self-pay | Attending: Dermatology | Admitting: Dermatology

## 2016-08-02 DIAGNOSIS — Z5321 Procedure and treatment not carried out due to patient leaving prior to being seen by health care provider: Secondary | ICD-10-CM | POA: Insufficient documentation

## 2016-08-02 DIAGNOSIS — R21 Rash and other nonspecific skin eruption: Secondary | ICD-10-CM | POA: Insufficient documentation

## 2016-08-02 NOTE — ED Notes (Signed)
Called Pt. 3 times and No answer

## 2016-08-02 NOTE — ED Triage Notes (Signed)
All over rash states her girlfrioend was tx fir scabies and now she thinks she has it

## 2016-10-08 ENCOUNTER — Encounter (HOSPITAL_COMMUNITY): Payer: Self-pay | Admitting: *Deleted

## 2016-10-08 ENCOUNTER — Emergency Department (HOSPITAL_COMMUNITY)
Admission: EM | Admit: 2016-10-08 | Discharge: 2016-10-08 | Disposition: A | Discharge status: Home / Self Care | Payer: Self-pay | Attending: Emergency Medicine | Admitting: Emergency Medicine

## 2016-10-08 DIAGNOSIS — L509 Urticaria, unspecified: Secondary | ICD-10-CM | POA: Insufficient documentation

## 2016-10-08 DIAGNOSIS — J039 Acute tonsillitis, unspecified: Secondary | ICD-10-CM | POA: Insufficient documentation

## 2016-10-08 DIAGNOSIS — N390 Urinary tract infection, site not specified: Secondary | ICD-10-CM | POA: Insufficient documentation

## 2016-10-08 DIAGNOSIS — Z79899 Other long term (current) drug therapy: Secondary | ICD-10-CM | POA: Insufficient documentation

## 2016-10-08 DIAGNOSIS — F17210 Nicotine dependence, cigarettes, uncomplicated: Secondary | ICD-10-CM | POA: Insufficient documentation

## 2016-10-08 HISTORY — DX: Calculus of kidney: N20.0

## 2016-10-08 LAB — URINALYSIS, ROUTINE W REFLEX MICROSCOPIC
BILIRUBIN URINE: NEGATIVE
Glucose, UA: NEGATIVE mg/dL
Ketones, ur: 5 mg/dL — AB
NITRITE: NEGATIVE
PROTEIN: 100 mg/dL — AB
SPECIFIC GRAVITY, URINE: 1.027 (ref 1.005–1.030)
pH: 5 (ref 5.0–8.0)

## 2016-10-08 LAB — RAPID STREP SCREEN (MED CTR MEBANE ONLY): Streptococcus, Group A Screen (Direct): POSITIVE — AB

## 2016-10-08 MED ORDER — FLUCONAZOLE 150 MG PO TABS: 150.0000 mg | ORAL_TABLET | Freq: Once | ORAL | 0 refills | Status: AC

## 2016-10-08 MED ORDER — CEPHALEXIN 500 MG PO CAPS: 500.0000 mg | ORAL_CAPSULE | Freq: Four times a day (QID) | ORAL | 0 refills | Status: AC

## 2016-10-08 MED ORDER — CEPHALEXIN 250 MG PO CAPS
500.0000 mg | ORAL_CAPSULE | Freq: Once | ORAL | Status: AC
  Administered 2016-10-08: 500 mg via ORAL
  Filled 2016-10-08: qty 2

## 2016-10-08 MED ORDER — ACETAMINOPHEN 325 MG PO TABS
650.0000 mg | ORAL_TABLET | Freq: Once | ORAL | Status: AC | PRN
  Administered 2016-10-08: 650 mg via ORAL

## 2016-10-08 MED ORDER — ACETAMINOPHEN 325 MG PO TABS
ORAL_TABLET | ORAL | Status: AC
  Filled 2016-10-08: qty 2

## 2016-10-08 MED ORDER — DIPHENHYDRAMINE HCL 25 MG PO CAPS
50.0000 mg | ORAL_CAPSULE | Freq: Once | ORAL | Status: AC
  Administered 2016-10-08: 50 mg via ORAL
  Filled 2016-10-08: qty 2

## 2016-10-08 NOTE — ED Provider Notes (Signed)
[facility]-EMERGENCY DEPT Provider Note   CSN: [id number] Arrival date & time: 10/08/16  1212     History   Chief Complaint Chief Complaint  Patient presents with  . Fever  . Sore Throat  . Back Pain    HPI Kayla Reese is a 41 y.o. female.  She presents for evaluation of sore throat present for several days.  Also complains of urine order for 1 day.  Since arriving into the emergency department she has developed a rash.  She denies fever, chills, nausea, vomiting, weakness or dizziness.  There are no other known modifying factors.  HPI  Past Medical History:  Diagnosis Date  . Hepatitis C   . Kidney stone   . Substance dependence (HCC)    on suboxone    Patient Active Problem List   Diagnosis Date Noted  . Polysubstance (excluding opioids) dependence, daily use (HCC) 09/01/2015  . 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 Hepatitis, viral 01/12/2014  . Substance abuse in remission 01/12/2014    Past Surgical History:  Procedure Laterality Date  . ABDOMINAL SURGERY    . arm surgery    . CESAREAN SECTION    . NOVASURE ABLATION    . TUBAL LIGATION      OB History    No data available       Home Medications    Prior to Admission medications   Medication Sig Start Date End Date Taking? Authorizing Provider  acetaminophen (TYLENOL) 500 MG tablet Take 1 tablet (500 mg total) by mouth every 6 (six) hours as needed. 09/02/15   Thermon Leyland, NP  cephALEXin (KEFLEX) 500 MG capsule Take 1 capsule (500 mg total) by mouth 4 (four) times daily. 10/08/16   Mancel Bale, MD  fluconazole (DIFLUCAN) 150 MG tablet Take 1 tablet (150 mg total) by mouth once. 10/08/16 10/08/16  Mancel Bale, MD  hydrOXYzine (ATARAX/VISTARIL) 25 MG tablet Take 1 tablet (25 mg total) by mouth every 6 (six) hours as needed for anxiety. 09/02/15   Thermon Leyland, NP  ibuprofen (ADVIL,MOTRIN) 800 MG tablet Take 1 tablet (800 mg total) by mouth 3 (three) times daily.  05/09/16   Garlon Hatchet, PA-C  traZODone (DESYREL) 50 MG tablet Take 1 tablet (50 mg total) by mouth at bedtime as needed for sleep. 09/02/15   Thermon Leyland, NP    Family History History reviewed. No pertinent family history.  Social History Social History  Substance Use Topics  . Smoking status: Current Every Day Smoker    Packs/day: 0.50    Types: Cigarettes  . Smokeless tobacco: Never Used  . Alcohol use No     Allergies   Erythromycin   Review of Systems Review of Systems  All other systems reviewed and are negative.    Physical Exam Updated Vital Signs BP 112/77 (BP Location: Right Arm)   Pulse (!) 106   Temp 98.9 F (37.2 C) (Oral)   Resp 20   SpO2 97%   Physical Exam  Constitutional: She is oriented to person, place, and time. She appears well-developed and well-nourished. She appears distressed.  HENT:  Head: Normocephalic and atraumatic.  Bilateral tonsillar hypertrophy, with exudate  Eyes: Pupils are equal, round, and reactive to light. Conjunctivae and EOM are normal.  Neck: Normal range of motion and phonation normal. Neck supple.  Cardiovascular: Normal rate and regular rhythm.   Pulmonary/Chest: Effort normal and breath sounds normal. She exhibits no tenderness.  Abdominal: Soft.  She exhibits no distension. There is no tenderness. There is no guarding.  Musculoskeletal: Normal range of motion.  Neurological: She is alert and oriented to person, place, and time. She exhibits normal muscle tone.  Skin: Skin is warm and dry.  Generalized urticarial rash  Psychiatric: She has a normal mood and affect. Her behavior is normal. Judgment and thought content normal.  Nursing note and vitals reviewed.    ED Treatments / Results  Labs (all labs ordered are listed, but only abnormal results are displayed) Labs Reviewed  RAPID STREP SCREEN (NOT AT [facility]) - Abnormal; Notable for the following:       Result Value   Streptococcus, Group A Screen (Direct)  POSITIVE (*)    All other components within normal limits  URINALYSIS, ROUTINE W REFLEX MICROSCOPIC - Abnormal; Notable for the following:    APPearance CLOUDY (*)    Hgb urine dipstick LARGE (*)    Ketones, ur 5 (*)    Protein, ur 100 (*)    Leukocytes, UA LARGE (*)    Bacteria, UA FEW (*)    Squamous Epithelial / LPF 6-30 (*)    All other components within normal limits    EKG  EKG Interpretation None       Radiology No results found.  Procedures Procedures (including critical care time)  Medications Ordered in ED Medications  acetaminophen (TYLENOL) 325 MG tablet (not administered)  diphenhydrAMINE (BENADRYL) capsule 50 mg (not administered)  cephALEXin (KEFLEX) capsule 500 mg (not administered)  acetaminophen (TYLENOL) tablet 650 mg (650 mg Oral Given 10/08/16 1254)     Initial Impression / Assessment and Plan / ED Course  I have reviewed the triage vital signs and the nursing notes.  Pertinent labs & imaging results that were available during my care of the patient were reviewed by me and considered in my medical decision making (see chart for details).      Patient Vitals for the past 24 hrs:  BP Temp Temp src Pulse Resp SpO2  10/08/16 1450 112/77 98.9 F (37.2 C) Oral (!) 106 20 97 %  10/08/16 1243 123/79 (!) 100.5 F (38.1 C) Oral (!) 109 18 95 %    3:15 PM Reevaluation with update and discussion. After initial assessment and treatment, an updated evaluation reveals no change in clinical status.  She requested Diflucan to use following treatment with antibiotic medication.  Findings discussed with patient and all questions were answered. [name] L    Final Clinical Impressions(s) / ED Diagnoses   Final diagnoses:  Tonsillitis  Lower urinary tract infectious disease  Urticaria   Valuations consistent with tonsillitis, streptococcal.  Also apparent UTI.  Patient developed hives while here in the ED, which are likely stress and nerve related.   Doubt internal allergic reaction, sepsis or metabolic instability.  Nursing Notes Reviewed/ Care Coordinated Applicable Imaging Reviewed Interpretation of Laboratory Data incorporated into ED treatment  The patient appears reasonably screened and/or stabilized for discharge and I doubt any other medical condition or other [facility] requiring further screening, evaluation, or treatment in the ED at this time prior to discharge.  Plan: Home Medications-OTC antihistamine for rash; Home Treatments-rest, fluids; return here if the recommended treatment, does not improve the symptoms; Recommended follow up-PCP, as needed   New Prescriptions New Prescriptions   CEPHALEXIN (KEFLEX) 500 MG CAPSULE    Take 1 capsule (500 mg total) by mouth 4 (four) times daily.   FLUCONAZOLE (DIFLUCAN) 150 MG TABLET    Take 1  tablet (150 mg total) by mouth once.     Mancel BaleWentz, Keyly Baldonado, MD 10/08/16 [phone]

## 2016-10-08 NOTE — ED Triage Notes (Signed)
Pt reports onset last night of lower back pain. Reports being on her menstrual, also has hx of kidney stones. Has sore throat and fever since last night.

## 2016-10-08 NOTE — Discharge Instructions (Signed)
We are treating you with Keflex, to treat both the tonsillitis and urinary tract infection.  For hives, take Benadryl 25 mg 4 times a day.  You can also use Pepcid 20 mg twice a day if needed, additionally to control the itching.  Return here, if needed, for problems.

## 2016-10-08 NOTE — ED Notes (Signed)
EDP at bedside  

## 2016-10-08 NOTE — ED Notes (Signed)
Pt ambulatory to restroom with steady gait.

## 2016-10-27 ENCOUNTER — Emergency Department (HOSPITAL_COMMUNITY)
Admission: EM | Admit: 2016-10-27 | Discharge: 2016-10-27 | Disposition: A | Discharge status: Home / Self Care | Payer: Self-pay | Attending: Emergency Medicine | Admitting: Emergency Medicine

## 2016-10-27 DIAGNOSIS — L237 Allergic contact dermatitis due to plants, except food: Secondary | ICD-10-CM | POA: Insufficient documentation

## 2016-10-27 DIAGNOSIS — L299 Pruritus, unspecified: Secondary | ICD-10-CM | POA: Insufficient documentation

## 2016-10-27 DIAGNOSIS — F17210 Nicotine dependence, cigarettes, uncomplicated: Secondary | ICD-10-CM | POA: Insufficient documentation

## 2016-10-27 MED ORDER — DIPHENHYDRAMINE HCL 25 MG PO CAPS
25.0000 mg | ORAL_CAPSULE | Freq: Once | ORAL | Status: AC
  Administered 2016-10-27: 25 mg via ORAL
  Filled 2016-10-27: qty 1

## 2016-10-27 MED ORDER — DIPHENHYDRAMINE HCL 25 MG PO TABS: 25.0000 mg | ORAL_TABLET | Freq: Four times a day (QID) | ORAL | 0 refills | Status: AC

## 2016-10-27 MED ORDER — BETAMETHASONE SOD PHOS & ACET 6 (3-3) MG/ML IJ SUSP
6.0000 mg | Freq: Once | INTRAMUSCULAR | Status: AC
  Administered 2016-10-27: 6 mg via INTRAMUSCULAR
  Filled 2016-10-27: qty 1

## 2016-10-27 NOTE — ED Provider Notes (Signed)
[facility]-EMERGENCY DEPT Provider Note   CSN: [id number] Arrival date & time: 10/27/16  1132  By signing my name below, I, Deland PrettySherilynn Knight, attest that this documentation has been prepared under the direction and in the presence of Kerrie BuffaloHope Barabara Motz, NP Electronically Signed: Deland PrettySherilynn Knight, ED Scribe. 10/27/16. 2:17 PM.  History   Chief Complaint Chief Complaint  Patient presents with  . Poison Ivy   The history is provided by the patient. No language interpreter was used.  Poison Kayla Reese  This is a new problem. The current episode started more than 2 days ago. The problem occurs constantly. The problem has not changed since onset.Pertinent negatives include no abdominal pain, no headaches and no shortness of breath. Nothing aggravates the symptoms. Nothing relieves the symptoms.   HPI Comments: Kayla Reese is a 41 y.o. female who presents to the Emergency Department complaining of a pruritic generalized body rash that began a week ago. The pt states that she was weeding in a friends garden prior to the onset of her symptoms. Per note, the pt has applied topical benadryl with inadequate relief. The pt has a h/x of substance (cocaine) and EtOH abuse. The pt plans on checking into a rehab facility tomorrow and requesting shot for the poison oak rather than medications that she will have to take. She states that she was turned away today due to the rash. The pt denies SOB and fever.   Past Medical History:  Diagnosis Date  . Hepatitis C   . Kidney stone   . Substance dependence (HCC)    on suboxone    Patient Active Problem List   Diagnosis Date Noted  . Polysubstance (excluding opioids) dependence, daily use (HCC) 09/01/2015  . 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 Hepatitis, viral 01/12/2014  . Substance abuse in remission 01/12/2014    Past Surgical History:  Procedure Laterality Date  . ABDOMINAL SURGERY    . arm surgery    . CESAREAN SECTION    .  NOVASURE ABLATION    . TUBAL LIGATION      OB History    No data available       Home Medications    Prior to Admission medications   Medication Sig Start Date End Date Taking? Authorizing Provider  acetaminophen (TYLENOL) 500 MG tablet Take 1 tablet (500 mg total) by mouth every 6 (six) hours as needed. 09/02/15   Thermon Leylandavis, Laura A, NP  cephALEXin (KEFLEX) 500 MG capsule Take 1 capsule (500 mg total) by mouth 4 (four) times daily. 10/08/16   Mancel BaleWentz, Elliott, MD  diphenhydrAMINE (BENADRYL) 25 MG tablet Take 1 tablet (25 mg total) by mouth every 6 (six) hours. 10/27/16   Janne NapoleonNeese, Gerlene Glassburn M, NP  hydrOXYzine (ATARAX/VISTARIL) 25 MG tablet Take 1 tablet (25 mg total) by mouth every 6 (six) hours as needed for anxiety. 09/02/15   Thermon Leylandavis, Laura A, NP  ibuprofen (ADVIL,MOTRIN) 800 MG tablet Take 1 tablet (800 mg total) by mouth 3 (three) times daily. 05/09/16   Garlon HatchetSanders, Lisa M, PA-C  traZODone (DESYREL) 50 MG tablet Take 1 tablet (50 mg total) by mouth at bedtime as needed for sleep. 09/02/15   Thermon Leylandavis, Laura A, NP    Family History No family history on file.  Social History Social History  Substance Use Topics  . Smoking status: Current Every Day Smoker    Packs/day: 0.50    Types: Cigarettes  . Smokeless tobacco: Never Used  . Alcohol use No  Allergies   Erythromycin   Review of Systems Review of Systems  Constitutional: Negative for fever.  HENT: Negative for sore throat and trouble swallowing.   Respiratory: Negative for cough and shortness of breath.   Gastrointestinal: Negative for abdominal pain and nausea.  Skin: Positive for rash.  Neurological: Negative for headaches.     Physical Exam Updated Vital Signs BP 106/71 (BP Location: Right Arm)   Pulse 60   Temp 97.7 F (36.5 C) (Oral)   Resp 16   Wt 52.2 kg (115 lb)   SpO2 99%   BMI 19.74 kg/m   Physical Exam  Constitutional: She appears well-developed and well-nourished. No distress.  HENT:  Head: Normocephalic and  atraumatic.  Mouth/Throat: Uvula is midline.  Uvula is midline, no edema or erythema.  Eyes: EOM are normal.  Neck: Normal range of motion.  Cardiovascular: Normal rate and regular rhythm.   Pulmonary/Chest: Effort normal and breath sounds normal.  Abdominal: Soft. She exhibits no distension. There is no tenderness.  Musculoskeletal: Normal range of motion.  See skin exam  Neurological: She is alert.  Skin: Skin is warm and dry.  Linear vesicular lesions to the right lower leg, left upper leg, right upper leg, right wrist and forearm, left forearm, multiple areas to the trunk and the right side of the neck and face. No red streaking, no signs of infection.  Psychiatric: She has a normal mood and affect.  Nursing note and vitals reviewed.    ED Treatments / Results   DIAGNOSTIC STUDIES: Oxygen Saturation is 97% on RA, normal by my interpretation.   COORDINATION OF CARE: 12:37 PM-Discussed next steps with pt. Pt verbalized understanding and is agreeable with the plan.   Labs (all labs ordered are listed, but only abnormal results are displayed) Labs Reviewed - No data to display  Radiology No results found.  Procedures Procedures (including critical care time)  Medications Ordered in ED Medications  betamethasone acetate-betamethasone sodium phosphate (CELESTONE) injection 6 mg (6 mg Intramuscular Given 10/27/16 1300)  diphenhydrAMINE (BENADRYL) capsule 25 mg (25 mg Oral Given 10/27/16 1300)     Initial Impression / Assessment and Plan / ED Course  I have reviewed the triage vital signs and the nursing notes. Poison Ivy Pt presentation consistant with poison ivy. Discussed home care. Treated with Rx for Benadryl. Celestone Soluspan IM given prior to d/c. D Return precautions discussed. Pt afebrile and in NAD prior to dc. Airway intact without compromise.   Final Clinical Impressions(s) / ED Diagnoses   Final diagnoses:  Poison ivy dermatitis    New  Prescriptions Discharge Medication List as of 10/27/2016 12:43 PM    START taking these medications   Details  diphenhydrAMINE (BENADRYL) 25 MG tablet Take 1 tablet (25 mg total) by mouth every 6 (six) hours., Starting Mon 10/27/2016, Print      I personally performed the services described in this documentation, which was scribed in my presence. The recorded information has been reviewed and is accurate.    Kerrie Buffalo [place], [place] 10/27/16 1422    Nira Conn, MD 10/27/16 [phone]

## 2016-10-27 NOTE — Discharge Instructions (Signed)
Take the Benadryl as needed for itching. Return here as needed.

## 2016-10-27 NOTE — ED Triage Notes (Signed)
Pt reports she was helping weed a friend's yard and got into [place]. She reports itching and pain all over her body and states she has watched it spread over the last few days. She has been using topical benadryl to aid in the itching. Pt states she is trying to go into a treatment center and they will not allow her to until she gets treated for the rash.

## 2016-10-27 NOTE — Progress Notes (Signed)
CM noted 4 ED visits in the last 6 months with no PCP and no ins.  Pt states [facility] and she is unsure if it will be for 30 days on 90 days.  Pt also states that she is unsure if she will be staying in state when she leaves [facility].  Discussed the clinics with pt and location.  Pt was unable to say if she had a preference at this time due to not knowing where she will be living when she leaves treatment.  CM encouraged pt that if she stays locally to call one of the CM's when she leaves [facility] to get her set up with one of the indigent clinics.  Pt agreed and will call if she's staying.  No further CM needs noted at this time.

## 2016-10-27 NOTE — ED Notes (Signed)
Pt is requesting a shot of steroid or something to expedite the process of clearing up the rash.

## 2016-11-25 ENCOUNTER — Ambulatory Visit (HOSPITAL_COMMUNITY)
Admission: RE | Admit: 2016-11-25 | Discharge: 2016-11-25 | Disposition: A | Discharge status: Home / Self Care | Payer: Self-pay | Attending: Psychiatry | Admitting: Psychiatry

## 2017-07-25 IMAGING — CR DG CHEST 2V
2 series · 2 of 2 positions shown · non-contrast
Comparison: Chest radiograph 02/01/2015

CLINICAL DATA: Duster inhalation.

EXAM:
CHEST  2 VIEW

[w chest lat]
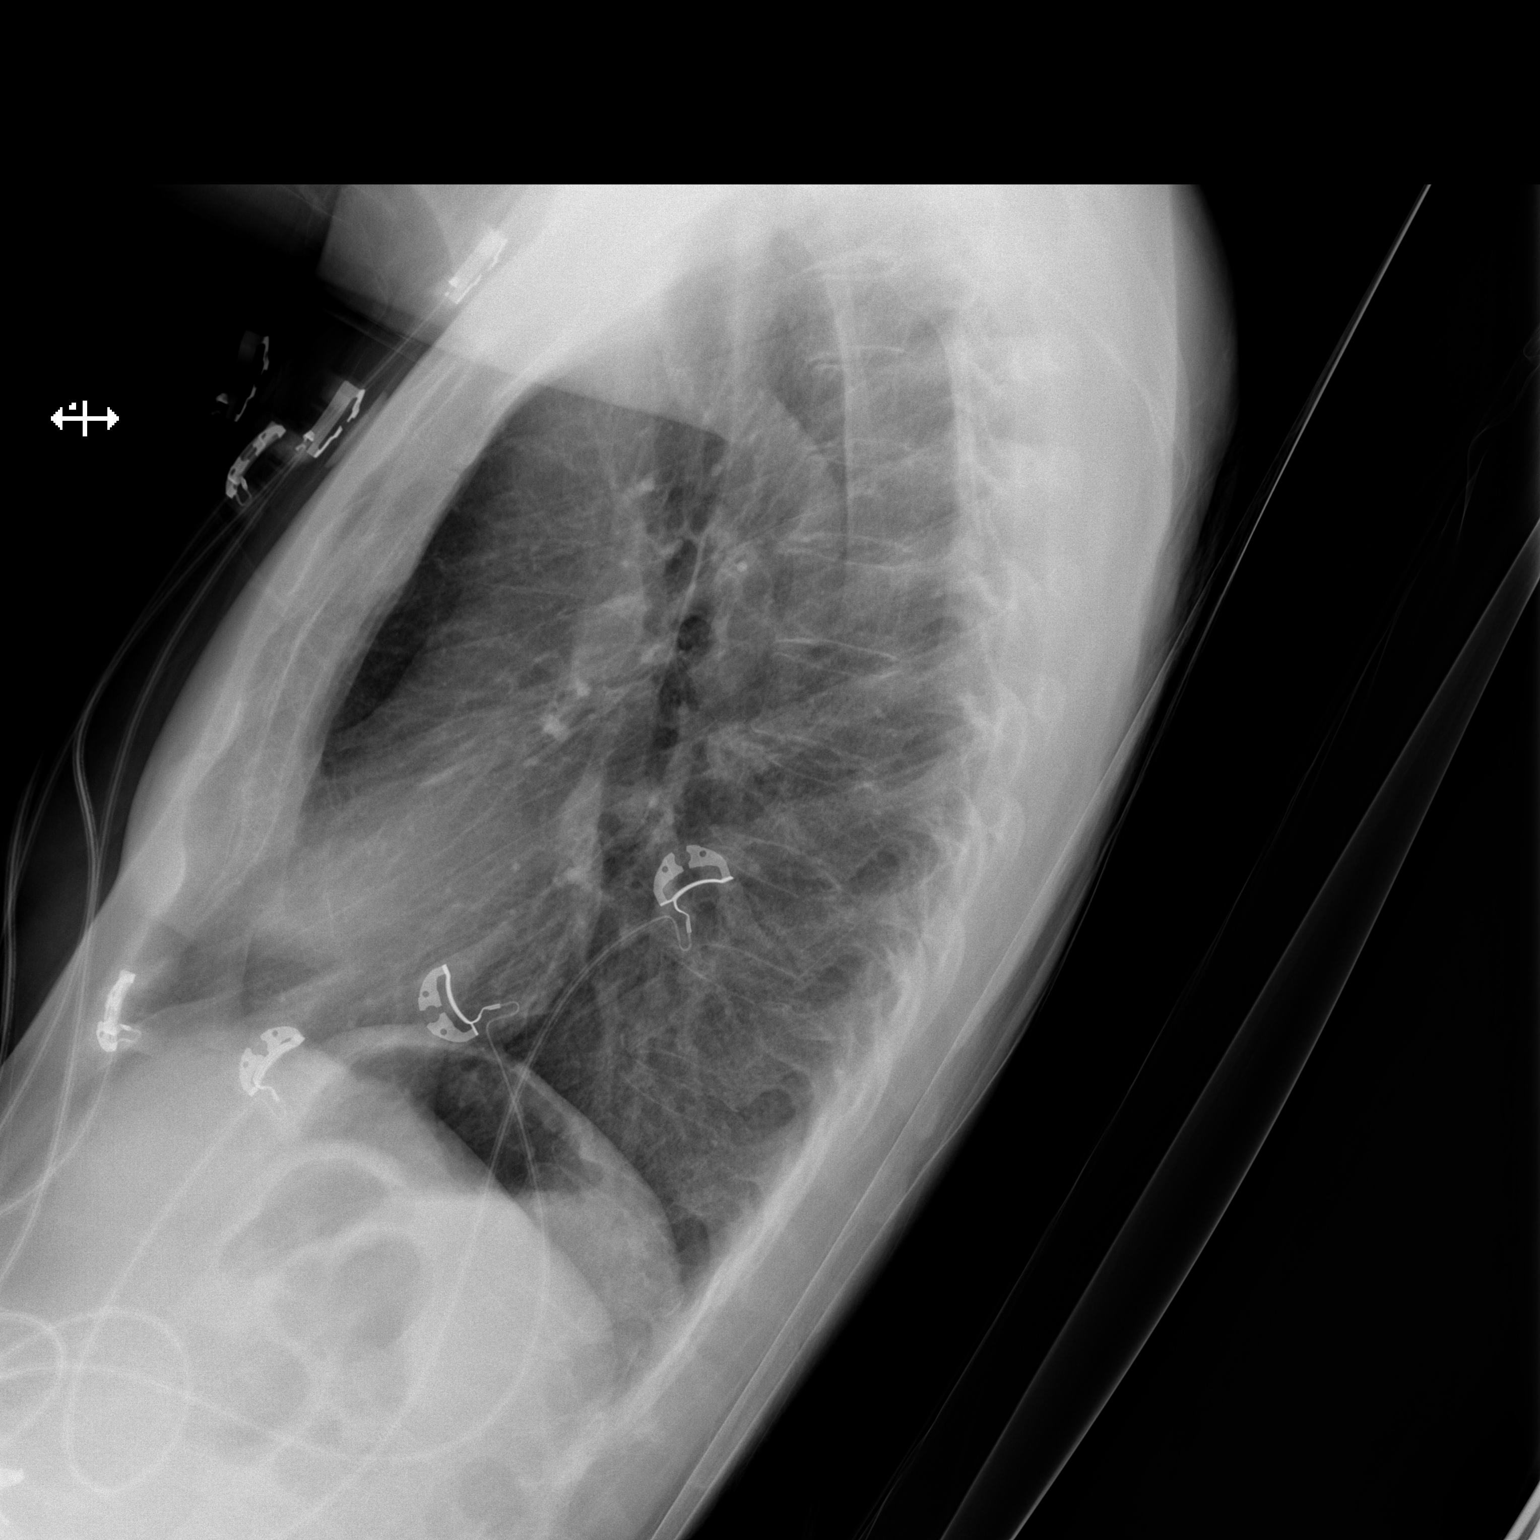

[x chest ap]
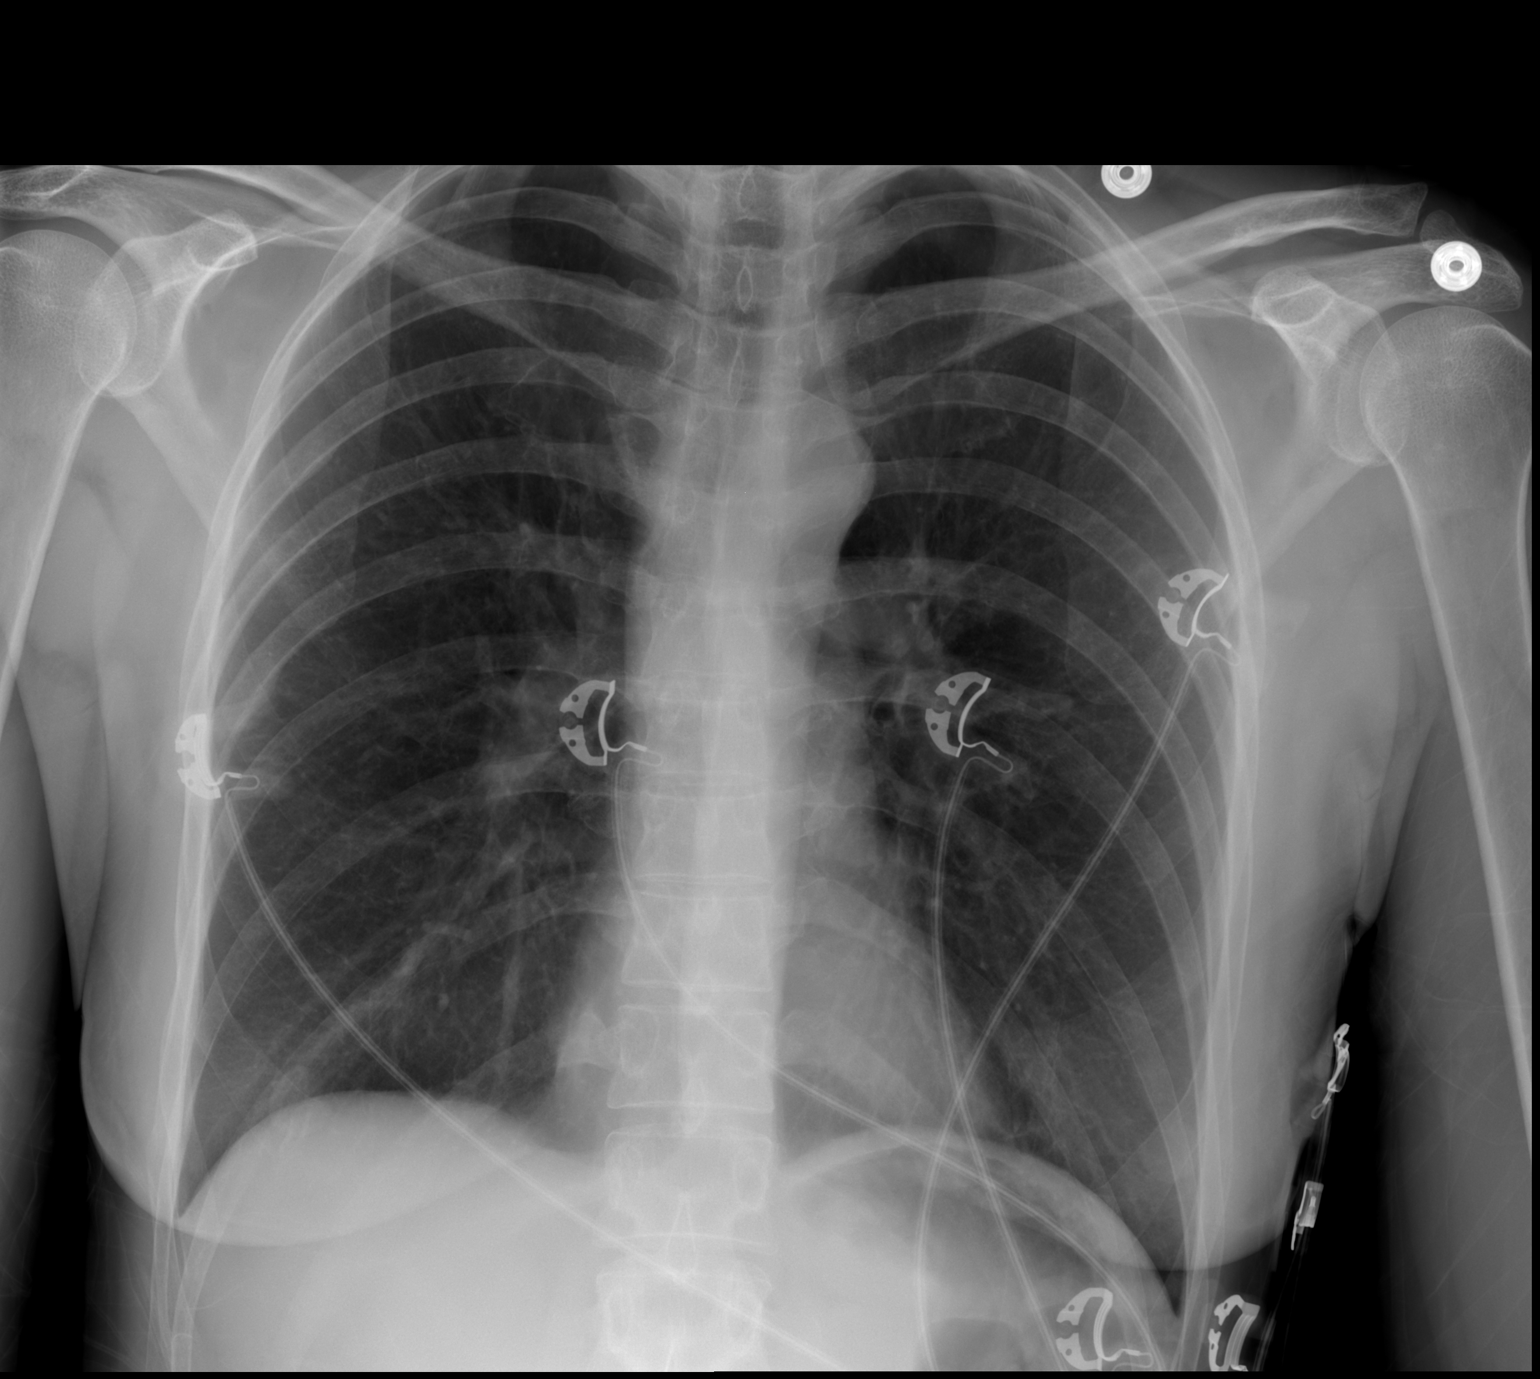

[2 of 2 positions shown; findings below may reference images not displayed]

FINDINGS: The cardiomediastinal contours are normal. The lungs are borderline
hyperinflated, unchanged. Pulmonary vasculature is normal. No
consolidation, pleural effusion, or pneumothorax. No acute osseous
abnormalities are seen. The remote right rib fractures are partially
obscured.
IMPRESSION: Stable mild hyperinflation.  No acute process.
